# Patient Record
Sex: Female | Born: 1960 | Race: Black or African American | Hispanic: No | Marital: Married | Smoking: Former smoker
Health system: Southern US, Community
[De-identification: ages and names within clinical notes are randomized; demographics above are authoritative.]

## PROBLEM LIST (undated history)

## (undated) DIAGNOSIS — I059 Rheumatic mitral valve disease, unspecified: Secondary | ICD-10-CM

## (undated) DIAGNOSIS — E041 Nontoxic single thyroid nodule: Secondary | ICD-10-CM

## (undated) DIAGNOSIS — F32A Depression, unspecified: Secondary | ICD-10-CM

## (undated) DIAGNOSIS — I00 Rheumatic fever without heart involvement: Secondary | ICD-10-CM

## (undated) DIAGNOSIS — I10 Essential (primary) hypertension: Secondary | ICD-10-CM

## (undated) HISTORY — PX: OTHER SURGICAL HISTORY: SHX169

## (undated) HISTORY — PX: MITRAL VALVE REPLACEMENT: SHX147

## (undated) HISTORY — PX: SINUS SURGERY: SHX187

---

## 2005-06-07 ENCOUNTER — Emergency Department: Admit: 2005-06-07 | Payer: Self-pay | Source: Emergency Department | Admitting: Emergency Medicine

## 2007-11-11 ENCOUNTER — Emergency Department: Admit: 2007-11-11 | Payer: Self-pay | Source: Emergency Department | Admitting: Emergency Medicine

## 2008-01-26 ENCOUNTER — Emergency Department: Admit: 2008-01-26 | Payer: Self-pay | Source: Emergency Department | Admitting: Emergency Medicine

## 2010-05-18 ENCOUNTER — Emergency Department: Admit: 2010-05-18 | Payer: Self-pay | Source: Emergency Department | Admitting: Internal Medicine

## 2010-05-18 LAB — CBC AND DIFFERENTIAL
Basophils Absolute Automated: 0.01 10*3/uL (ref 0.00–0.20)
Basophils Automated: 0 % (ref 0–2)
Eosinophils Absolute Automated: 0.12 10*3/uL (ref 0.00–0.70)
Eosinophils Automated: 2 % (ref 0–5)
Hematocrit: 41.1 % (ref 37.0–47.0)
Hgb: 14.1 g/dL (ref 12.0–16.0)
Immature Granulocytes Absolute: 0.02 10*3/uL
Immature Granulocytes: 0 % (ref 0–1)
Lymphocytes Absolute Automated: 2.12 10*3/uL (ref 0.50–4.40)
Lymphocytes Automated: 31 % (ref 15–41)
MCH: 33.2 pg — ABNORMAL HIGH (ref 28.0–32.0)
MCHC: 34.3 g/dL (ref 32.0–36.0)
MCV: 96.7 fL (ref 80.0–100.0)
MPV: 9.6 fL (ref 9.4–12.3)
Monocytes Absolute Automated: 0.7 10*3/uL (ref 0.00–1.20)
Monocytes: 10 % (ref 0–11)
Neutrophils Absolute: 3.82 10*3/uL (ref 1.80–8.10)
Neutrophils: 57 % (ref 52–75)
Nucleated RBC: 0 /100 WBC
Platelets: 236 10*3/uL (ref 140–400)
RBC: 4.25 10*6/uL (ref 4.20–5.40)
RDW: 13 % (ref 12–15)
WBC: 6.77 10*3/uL (ref 3.50–10.80)

## 2010-05-18 LAB — COMPREHENSIVE METABOLIC PANEL
ALT: 15 U/L (ref 0–55)
AST (SGOT): 16 U/L (ref 5–34)
Albumin/Globulin Ratio: 1.1 (ref 0.9–2.2)
Albumin: 3.7 g/dL (ref 3.5–5.0)
Alkaline Phosphatase: 64 U/L (ref 40–150)
Anion Gap: 9 (ref 5.0–15.0)
BUN: 14 mg/dL (ref 7.0–19.0)
Bilirubin, Total: 0.5 mg/dL (ref 0.2–1.2)
CO2: 29 mEq/L (ref 22–29)
Calcium: 9.3 mg/dL (ref 8.5–10.5)
Chloride: 104 mEq/L (ref 98–107)
Creatinine: 0.9 mg/dL (ref 0.6–1.0)
Globulin: 3.3 g/dL (ref 2.0–3.6)
Glucose: 98 mg/dL (ref 70–100)
Potassium: 3.8 mEq/L (ref 3.5–5.1)
Protein, Total: 7 g/dL (ref 6.0–8.3)
Sodium: 142 mEq/L (ref 136–145)

## 2010-05-18 LAB — HEMOLYSIS INDEX: Hemolysis Index: 14 Index (ref 0–18)

## 2010-05-18 LAB — GFR: EGFR: >60

## 2011-01-13 ENCOUNTER — Emergency Department
Admit: 2011-01-13 | Disposition: A | Discharge status: Home / Self Care | Payer: Self-pay | Source: Emergency Department | Admitting: Internal Medicine

## 2011-02-25 ENCOUNTER — Emergency Department
Admit: 2011-02-25 | Discharge: 2011-02-25 | Disposition: A | Discharge status: Home / Self Care | Payer: Self-pay | Source: Emergency Department | Admitting: Pediatric Emergency Medicine

## 2011-02-25 LAB — CBC AND DIFFERENTIAL
Basophils Absolute Automated: 0.01 10*3/uL (ref 0.00–0.20)
Basophils Automated: 0 % (ref 0–2)
Eosinophils Absolute Automated: 0.03 10*3/uL (ref 0.00–0.70)
Eosinophils Automated: 1 % (ref 0–5)
Hematocrit: 45.3 % (ref 37.0–47.0)
Hgb: 15.6 g/dL (ref 12.0–16.0)
Immature Granulocytes Absolute: 0.01 10*3/uL
Immature Granulocytes: 0 % (ref 0–1)
Lymphocytes Absolute Automated: 1.66 10*3/uL (ref 0.50–4.40)
Lymphocytes Automated: 31 % (ref 15–41)
MCH: 32.4 pg — ABNORMAL HIGH (ref 28.0–32.0)
MCHC: 34.4 g/dL (ref 32.0–36.0)
MCV: 94 fL (ref 80.0–100.0)
MPV: 9.9 fL (ref 9.4–12.3)
Monocytes Absolute Automated: 0.49 10*3/uL (ref 0.00–1.20)
Monocytes: 9 % (ref 0–11)
Neutrophils Absolute: 3.16 10*3/uL (ref 1.80–8.10)
Neutrophils: 59 % (ref 52–75)
Nucleated RBC: 0 /100 WBC
Platelets: 246 10*3/uL (ref 140–400)
RBC: 4.82 10*6/uL (ref 4.20–5.40)
RDW: 13 % (ref 12–15)
WBC: 5.35 10*3/uL (ref 3.50–10.80)

## 2011-02-25 LAB — HEMOLYSIS INDEX: Hemolysis Index: 2 Index (ref 0–18)

## 2011-02-25 LAB — BASIC METABOLIC PANEL
Anion Gap: 8 (ref 5.0–15.0)
BUN: 12 mg/dL (ref 7.0–19.0)
CO2: 29 mEq/L (ref 22–29)
Calcium: 10.4 mg/dL (ref 8.5–10.5)
Chloride: 101 mEq/L (ref 98–107)
Creatinine: 0.9 mg/dL (ref 0.6–1.0)
Glucose: 98 mg/dL (ref 70–100)
Potassium: 3.9 mEq/L (ref 3.5–5.1)
Sodium: 138 mEq/L (ref 136–145)

## 2011-02-25 LAB — GFR: EGFR: >60

## 2011-11-21 ENCOUNTER — Emergency Department
Admission: EM | Admit: 2011-11-21 | Discharge: 2011-11-21 | Disposition: A | Discharge status: Home / Self Care | Payer: Enrolled Prime—HMO | Attending: Emergency Medicine | Admitting: Emergency Medicine

## 2011-11-21 ENCOUNTER — Emergency Department: Payer: Enrolled Prime—HMO

## 2011-11-21 DIAGNOSIS — M542 Cervicalgia: Secondary | ICD-10-CM | POA: Insufficient documentation

## 2011-11-21 DIAGNOSIS — IMO0002 Reserved for concepts with insufficient information to code with codable children: Secondary | ICD-10-CM | POA: Insufficient documentation

## 2011-11-21 DIAGNOSIS — G8929 Other chronic pain: Secondary | ICD-10-CM | POA: Insufficient documentation

## 2011-11-21 DIAGNOSIS — M5416 Radiculopathy, lumbar region: Secondary | ICD-10-CM

## 2011-11-21 DIAGNOSIS — M549 Dorsalgia, unspecified: Secondary | ICD-10-CM | POA: Insufficient documentation

## 2011-11-21 HISTORY — DX: Essential (primary) hypertension: I10

## 2011-11-21 MED ORDER — IBUPROFEN 600 MG PO TABS
600.00 mg | ORAL_TABLET | Freq: Once | ORAL | Status: AC
Start: 2011-11-21 — End: 2011-11-21
  Administered 2011-11-21: 600 mg via ORAL
  Filled 2011-11-21: qty 1

## 2011-11-21 MED ORDER — IBUPROFEN 600 MG PO TABS
600.00 mg | ORAL_TABLET | Freq: Four times a day (QID) | ORAL | Status: AC | PRN
Start: 2011-11-21 — End: 2011-12-01

## 2011-11-21 MED ORDER — DIAZEPAM 5 MG PO TABS
5.00 mg | ORAL_TABLET | Freq: Once | ORAL | Status: AC
Start: 2011-11-21 — End: 2011-11-21
  Administered 2011-11-21: 5 mg via ORAL
  Filled 2011-11-21: qty 1

## 2011-11-21 MED ORDER — HYDROMORPHONE HCL PF 1 MG/ML IJ SOLN
0.50 mg | Freq: Once | INTRAMUSCULAR | Status: AC
Start: 2011-11-21 — End: 2011-11-21
  Administered 2011-11-21: 0.5 mg via INTRAMUSCULAR
  Filled 2011-11-21: qty 1

## 2011-11-21 NOTE — ED Notes (Signed)
Pt reports neck pain since may. Pt reports right arm numbness and weakness. Pt reprots left arm pain and numbness. Pt reports pain on ROM. Pt reports bilateral leg weakness. Pt was seen at [facility] hospital today and left because they werent addressing her pain. Pt is alert and oriented x3, nad

## 2011-11-21 NOTE — ED Provider Notes (Signed)
EMERGENCY DEPARTMENT HISTORY AND PHYSICAL EXAM    Date: 11/21/2011  Patient Name: Alicia Waters  Attending Physician: Lorenza Cambridge, MD  Mid-level: Baruch Merl      History of Presenting Illness     No chief complaint on file.      History Provided By: Patient    Chief Complaint: chronc neck pain/low back apin  Onset: neck since may, low back for almost 10 years  Timing: gradual  Location: bilateral neck, left low back  Quality: sharp stabbing, tight, aching  Severity: severe  Modifying Factors: Back Pain Red Flags: (-) CA, recent surgery, IV drug abuse, fevers, incontinence/retention, saddle anesthesia.         Additional History: Alicia Waters is a 51 y.o. female, hx of chronic low back pain x10 years and now neck pain since may that has been diagnosed as severe cervical disc disease by MRI at the [place]. Has been seeing a [place] doctor and "nothing they are giving me' is working for pain. Was referred to pain management and  Was given rx for robaxin and nucynta which she states also doesn't work. Pain is unchanges, tingling down left arm x2 weeks but her doctors are aware. Intermittent weakness in right arm she states but it is nonfocal and non at present. Pain in left low back radiates to level of foot and feels tingling. No red flags. Has only taken her Robaxin x1 today and no nucynta. Was seen earlier today at [facility] and left because they didn't control her pain.    PCP: No primary provider on file.    No current facility-administered medications for this encounter.  No current outpatient prescriptions on file.    Past Medical History     No past medical history on file.  No past surgical history on file.    Family History     No family history on file.      Social History     History     Social History   . Marital Status: Married     Spouse Name: N/A     Number of Children: N/A   . Years of Education: N/A     Social History Main Topics   . Smoking status: Not on file   . Smokeless tobacco: Not on file   .  Alcohol Use: Not on file   . Drug Use: Not on file   . Sexually Active: Not on file     Other Topics Concern   . Not on file     Social History Narrative   . No narrative on file         Allergies     Allergies not on file    Review of Systems       Review of Systems   Constitutional: Negative for fever and chills.   HENT: Positive for neck pain. Negative for ear pain, congestion and sore throat.    Eyes: Negative for blurred vision, pain and redness.   Respiratory: Negative for cough and shortness of breath.    Cardiovascular: Negative for chest pain.   Gastrointestinal: Negative for nausea, vomiting and abdominal pain.   Genitourinary:        No incontinence/retention or hematuria     Musculoskeletal: Positive for myalgias and back pain. Negative for joint pain and falls.   Neurological: Positive for tingling. Negative for dizziness, sensory change, focal weakness and headaches.   Psychiatric/Behavioral: Negative for substance abuse.  Physical Exam   BP 134/89  Pulse 85  Temp 97.6 F (36.4 C)  Resp 18  Ht 1.6 m  Wt 72.576 kg  BMI 28.34 kg/m2  SpO2 100%    CONSTITUTIONAL: Well-developed, well-nourished. Alert, cooperative and in   Moderate acute distress. Vital signs reviewed. Vital signs stable  HEAD: Normocephalic, atraumatic.  EYES: Normal Inspection; PERRL. EOM's intact. Sclera not injected. Conjunctiva clear. No discharge or tearing.   ENT: Normal nares without rhinorrhea. TM's clear bilaterally. Pharynx   visualized without erythema, tonsillar edema or exudates. Uvula midline, no trismus or drooling.  NECK: diffuse midline and paraspinal ttp throughout without focal bony tenderness. Able to AROM laterally to both left and right but limited due to pain.   RESPIRATORY: Good air movement bilaterally no respiratory distress. CTA throughout with no wheezing, rales or rhonchi. No retractions or use of accessory muscles. No respiratory distress.   CARDIAC: RRR. Normal S1 and S2 without murmurs, rubs or  gallops. Pedal pulses 2+.  GI: Soft and non-tender throughout. Non-distended, normal bowel sounds x4 quadrants,no pulsatile masses  BACK: Normal inspection,+ left low lumbar ttp, no midline or thoracic ttp throughout. +pain on movement with flexion. No scoliosis. + SLR left side.  MS: No bony tenderness. Full ROM without pain. No edema, erythema or STS.  Pulses 2+ and symmetric.    SKIN: Warm and dry. No rash or lesions. No abrasions or breaks in skin.  NEURO: A&O x 3. CN II-XII intact. 5/5 strength in bilateral upper and lower   extremities. Sensory intact throughout. Able to walk on heels and toes, patellar dtrs 2+, ambulates without assistance. Gait steady and balanced.  Sensory deficit not at present and no deficit on exam.  PSYCH: Normal affect, insight and concentration for age.       Diagnostic Study Results     Labs -     Results     ** No Results found for the last 24 hours. **          Radiologic Studies -   Radiology Results (24 Hour)     ** No Results found for the last 24 hours. **      .    Clinical Course in the Emergency Department     Consultations:  D/w Dr. Iris Pert who agreed with plan.    Re-evaluations:  7:57 PM  Pt reports significant improvement with pain meds,     Dr. Riki Altes was paged but no answering service      Medical Decision Making   I am the first provider for this patient.    I reviewed the vital signs, available nursing notes, past medical history, past surgical history, family history and social history.    Vital Signs-Reviewed the patient's vital signs.   Patient Vitals for the past 12 hrs:   BP Temp Pulse Resp   11/21/11 1834 134/89 mmHg 97.6 F (36.4 C) 85  18          Diagnosis and Treatment Plan       Clinical Impression: No diagnosis found.    Treatment Plan: chronic pain, needs pain meds to be taken as directed and muscle relaxers as well as NSAIDs. Will not give pain med rx here as she is under pain contract, will need to f/u with one of her doctors. Return precautions  provided    Attending's Note (MLP) Attestestation         Baruch Merl, [place]  11/21/11 2031

## 2011-11-21 NOTE — ED Provider Notes (Signed)
Pt was seen and examined by the PA. Plan of care was discussed with me.      ECG reviewed at 1949. HR 68, NSR, nl st segments, nl axis    Lorenza Cambridge, MD  11/21/11 2149

## 2011-11-21 NOTE — Discharge Instructions (Signed)
RETURN IF WORSE    WARM MOIST HEAT TO BACK    MOTRIN FOR PAIN, HOME MUSCLE RELAXERS AND PAIN MEDS    SEE YOUR DOCTORS FOR FOLLOW UP      Cervical Radiculopathy    You have been seen for a cervical radiculopathy.    Your spine has bones called "vertebrae." In between the bones there are soft cushions. These are called "disks." The disks keep the vertebrae from rubbing against each other. Inside of each disk is a thick, jelly-like substance called the "nucleus pulposus." Sometimes when the spine is injured, a disk is damaged and the nucleus pulposus leaks out of the disk. This is called a "disk herniation." As people age, the disks get brittle and delicate. If this happens, you might have a disk herniation. This is possible even if you don t remember getting injured.    Sometimes a herniated disk presses on a nerve in the spine. This causes pain near the herniated disk. Since you have symptoms in your neck and arm, the problem is in the cervical (neck) spine. Other problems can cause a radiculopathy (nerve pain), including a narrowed opening where the nerves come out.    Some symptoms of a cervical radiculopathy are:     Pain down one of your arms.   A burning feeling down your arm.   Numbness (pins and needles or loss of feeling) in your arm.   In serious cases, your arm may feel weak.    This problem is often treated with rest, physical therapy, and medication for the swelling and pain. If these treatments don t work, surgery may be needed. Sometimes taking steroids (like Prednisone) for a few days can help the pain.    We don't believe your condition is dangerous right now. However, you need to be careful. Sometimes a problem that seems small can get serious later. Therefore, it is very important for you to come back here or go to the nearest Emergency Department if you don t get better or your symptoms get worse.     You may have been referred to get an MRI of your spine or an EMG. These tests look at  your problem more closely.    Follow up with your doctor or the referral doctor as soon as possible.    YOU SHOULD SEEK MEDICAL ATTENTION IMMEDIATELY, EITHER HERE OR AT THE NEAREST EMERGENCY DEPARTMENT, IF ANY OF THE FOLLOWING OCCURS:     You lose bowel or bladder control (you soil or wet yourself).   You feel week or can t use your arm(s).   The medication doesn t help the pain.   You have a fever (temperature higher than 100.64F or 38C) or shaking chills.   You have serious pain over one bone (vertebra) in your neck.    If you can t follow up with your doctor, or if at any time you feel you need to be rechecked or seen again, come back here or go to the nearest emergency department.    Lumbar Radiculopathy    You have been seen for a lumbar radiculopathy.     Your spine has bones called "vertebrae." In between the bones there are soft cushions. These are called "disks." The disks keep the vertebrae from rubbing against each other. Inside of each disk is a thick, jelly-like substance called the "nucleus pulposus." Sometimes when the spine is injured, a disk is damaged and the nucleus pulposus leaks out of the disk.  This is called a "disk herniation." As people age, the disks get brittle and delicate. If this happens, you might have a disk herniation. This is possible even if you don t remember getting injured.    Sometimes a herniated disk presses on a nerve in the spine. This causes pain near the herniated disk. Since you have symptoms in your lower back and leg, the problem is in the lumbar (lower back) spine. Other problems can cause a radiculopathy (nerve pain), including a narrowed opening where the nerves come out.    Some symptoms of a lumbar radiculopathy are:     Pain down one of your legs.   A burning feeling down your leg.   Numbness (pins and needles or loss of feeling) in your leg.   In serious cases, your leg may feel weak.    This problem is often treated with rest, physical  therapy, and medication for the swelling and pain. If these treatments don t work, surgery may be needed. Sometimes taking steroids (like Prednisone) for a few days can help the pain.    We don't believe your condition is dangerous right now. However, you need to be careful. Sometimes a problem that seems small can get serious later. Therefore, it is very important for you to come back here or go to the nearest Emergency Department if you don t get better or your symptoms get worse.     You may have been referred to get an MRI of your spine or an EMG. These tests look at your problem more closely.    Follow up with your doctor or the referral doctor as soon as possible.    YOU SHOULD SEEK MEDICAL ATTENTION IMMEDIATELY, EITHER HERE OR AT THE NEAREST EMERGENCY DEPARTMENT, IF ANY OF THE FOLLOWING OCCURS:     You lose bowel or bladder control (you soil or wet yourself).   You feel week or can t use your leg(s).   The medication doesn t help the pain.   You have a fever (temperature higher than 100.51F or 38C) or shaking chills.   You have serious pain over one bone (vertebra) in your back.    If you can t follow up with your doctor, or if at any time you feel you need to be rechecked or seen again, come back here or go to the nearest emergency department.      Chronic Pain Exacerbation    You have been seen for your chronic pain problem.    While the Emergency Department/Urgent Care is available for emergencies related to your painful problem, you need to see your regular doctor for the ongoing care of your pain.    ALL refills for your pain medications MUST be done by your primary care doctor or the pain specialist who takes care of your pain.    The Emergency Department/Urgent Care is not the appropriate place for the ongoing care of chronic pain.    If you have not seen a pain specialist, ask the physician to give you some information on who to contact and to refer you to one.    THIS STATEMENT IS  PROVIDED FOR YOUR PROTECTION AND FOR YOUR INFORMATION ONLY AND IS NOT MEANT TO IMPLY ANY ILLEGAL ACTIVITY ON YOUR PART: It is a felony to obtain narcotic pain medications by intentionally deceiving the physician caring for you. This includes but is not limited to: Getting multiple prescriptions for pain medications by seeing more than one doctor, providing  any false information to obtain a prescription, giving any false information about the nature or severity of your medical condition.   Some common narcotic pain medications: Oxycodone (Percocet, Roxicet), Hydrocodone (Vicodin, Lortab, Norco), Oxycodone-long-acting (OxyContin), Meperidine (Demerol), Hydromorphone (Dilaudid), Morphine, Codeine (Tylenol #3, Tylenol #4), Propoxyphene (Darvon, Darvocet).    YOU SHOULD SEEK MEDICAL ATTENTION IMMEDIATELY, EITHER HERE OR AT THE NEAREST EMERGENCY DEPARTMENT, IF ANY OF THE FOLLOWING OCCURS:   You develop any significant change in your pain pattern.   Your symptoms become worse.   You have any other concerns.

## 2011-11-23 LAB — ECG 12-LEAD
Atrial Rate: 68 {beats}/min
P Axis: 70 degrees
P-R Interval: 150 ms
Q-T Interval: 440 ms
QRS Duration: 78 ms
QTC Calculation (Bezet): 467 ms
R Axis: 53 degrees
T Axis: 38 degrees
Ventricular Rate: 68 {beats}/min

## 2012-01-08 ENCOUNTER — Emergency Department
Admission: EM | Admit: 2012-01-08 | Discharge: 2012-01-08 | Disposition: A | Discharge status: Home / Self Care | Payer: Enrolled Prime—HMO | Attending: Emergency Medicine | Admitting: Emergency Medicine

## 2012-01-08 ENCOUNTER — Emergency Department: Payer: Enrolled Prime—HMO

## 2012-01-08 DIAGNOSIS — Z79899 Other long term (current) drug therapy: Secondary | ICD-10-CM | POA: Insufficient documentation

## 2012-01-08 DIAGNOSIS — R6889 Other general symptoms and signs: Secondary | ICD-10-CM | POA: Insufficient documentation

## 2012-01-08 DIAGNOSIS — I10 Essential (primary) hypertension: Secondary | ICD-10-CM | POA: Insufficient documentation

## 2012-01-08 DIAGNOSIS — F17200 Nicotine dependence, unspecified, uncomplicated: Secondary | ICD-10-CM | POA: Insufficient documentation

## 2012-01-08 DIAGNOSIS — R0989 Other specified symptoms and signs involving the circulatory and respiratory systems: Secondary | ICD-10-CM

## 2012-01-08 NOTE — ED Notes (Addendum)
As per pt. She had a throat biopsy done Dec 28, 2011, done at [facility], 2 days ago pt. Started having pain on the throat. Pt. States feels like there is food stuck inn the throat.

## 2012-01-08 NOTE — ED Provider Notes (Signed)
EMERGENCY DEPARTMENT HISTORY AND PHYSICAL EXAM    Date: 01/08/2012  Patient Name: Alicia Waters  Attending Physician: Ok Edwards, MD  Physician Extender: Arnold Long  Patient DOB:  1961-01-06  MRN:  [id number]  Room:  XB28/UX32      History of Presenting Illness     Chief Complaint: Foreign body sensation in throat    Historian:  Patient     51 y.o. female p/w a foreign body sensation in her throat x 2 days. She had a biopsy of a thyroid nodule performed 10 days ago. She had no symptoms after the procedure but developed a feeling of "swelling" late last week. She describes her discomfort as 2/10 and intermittent. She is handling PO fluids and food without difficulty. She denies drooling or voice changes. She denies pain in her throat. Her biopsy results showed that the nodule was benign. The procedure was performed by Dr. Uvaldo Bristle at [facility]. She has a hx of htn but is otherwise healthy and has no other complaints at this time.     Past Medical History     Past Medical History   Diagnosis Date   . Hypertensive disorder        Past Surgical History     Past Surgical History   Procedure Date   . Sinus surgery    . Throat biiopsy    . Cesarean section        Family History     History reviewed. No pertinent family history.    Social History     History     Social History   . Marital Status: Married     Spouse Name: N/A     Number of Children: N/A   . Years of Education: N/A     Social History Main Topics   . Smoking status: Current Everyday Smoker -- 0.5 packs/day     Types: Cigarettes   . Smokeless tobacco: Never Used   . Alcohol Use: No   . Drug Use: No   . Sexually Active: Not on file     Other Topics Concern   . Not on file     Social History Narrative   . No narrative on file       Allergies     No Known Allergies    Home Medications       (Not in a hospital admission)    ED Medications Administered     ED Medication Orders     None            Review of Systems     CONSTITUTIONAL: No fever, chills,  fatigue, weight loss or malaise.  ENMT: See HPI. No otalgia, otorrhea, rhinorrhea, voice changes, drooling, neck pain or stiffness.  CARDIAC: No chest pain.  RESPIRATORY: No cough, wheezing, stridor or shortness of breath.  GI: No abdominal pain, nausea, vomiting or changes in appetite.  NEURO: No headache, dizziness, weakness, numbness or LOC.   INTEGUMENTARY: No skin rash.    Physical Exam     CONSTITUTIONAL: Well-developed, well-nourished. Alert, cooperative and in   no acute distress. Vital signs reviewed.   HEAD: Normocephalic, atraumatic.  ENT: Normal nares without rhinorrhea. TM's clear bilaterally. Pharynx visualized without erythema, tonsillar edema or exudates. Uvula midline. No abrasions or foreign bodies visualized. The patient is speaking in a clear voice and is handling her secretions well.   NECK: Non tender. Full ROM without pain. No adenopathy.   RESPIRATORY: Good air movement bilaterally.  No wheezing, rales or rhonchi.   No retractions or use of accessory muscles. No respiratory distress.   CARDIAC: RRR. Normal S1 and S2 without murmurs, rubs or gallops.   SKIN: Warm and dry. No rash or lesions.   NEURO: A&O x 3. Gait steady and balanced.     Procedures     N/A    Diagnostic Study Results     EKG: N/A    Monitor: N/A    Laboratory results reviewed by ED provider:  Results     ** No Results found for the last 24 hours. **          Radiologic study results reviewed by ED provider:  Radiology Results (24 Hour)     ** No Results found for the last 24 hours. **      .    Rendering Provider: Arnold Long      VS     Filed Vitals:    01/08/12 1245   BP: 139/82   Pulse: 80   Temp: 97.8 F (36.6 C)   Resp: 18   SpO2: 98%         Clinical Course in Emergency Department     Consults: N/A    Reevaluation: The patient agrees to f/u with Dr. Uvaldo Bristle for further evaluation on Tuesday 01/10/12. She has been given strict instructions to return to the ED for any new or worsening symptoms.    Diagnosis and  Disposition     Diagnosis  Foreign body sensation - throat    Disposition  Home       SIGNED BY: Arnold Long, PA-C        Lendell Caprice [place], [place]  01/08/12 1313    Lendell Caprice [place], [place]  01/08/12 1314    Lendell Caprice [place], [place]  01/08/12 1317

## 2012-01-08 NOTE — Discharge Instructions (Signed)
Return to the ER if your symptoms worsen or if you develop throat swelling,   difficulty swallowing, drooling, voice changes, difficulty breathing, fever or   any other new symptoms or concerns.    Follow up with Dr. Uvaldo Bristle on Tuesday 01/10/12 for further evaluation.    Rest and stay well-hydrated.

## 2012-01-09 NOTE — ED Provider Notes (Signed)
I have reviewed the notes, assessments, and/or procedures performed by the PA, I concur with her/his documentation of KRYSTOL ROCCO.      Ok Edwards, MD  01/09/12 [phone]

## 2012-01-27 HISTORY — PX: CERVICAL FUSION: SHX112

## 2012-04-07 ENCOUNTER — Emergency Department
Admission: EM | Admit: 2012-04-07 | Discharge: 2012-04-07 | Disposition: A | Discharge status: Home / Self Care | Payer: Enrolled Prime—HMO | Attending: Emergency Medicine | Admitting: Emergency Medicine

## 2012-04-07 ENCOUNTER — Emergency Department: Payer: Enrolled Prime—HMO

## 2012-04-07 DIAGNOSIS — I10 Essential (primary) hypertension: Secondary | ICD-10-CM | POA: Insufficient documentation

## 2012-04-07 DIAGNOSIS — J069 Acute upper respiratory infection, unspecified: Secondary | ICD-10-CM | POA: Insufficient documentation

## 2012-04-07 DIAGNOSIS — Z981 Arthrodesis status: Secondary | ICD-10-CM | POA: Insufficient documentation

## 2012-04-07 DIAGNOSIS — J329 Chronic sinusitis, unspecified: Secondary | ICD-10-CM | POA: Insufficient documentation

## 2012-04-07 MED ORDER — AMOXICILLIN-POT CLAVULANATE 875-125 MG PO TABS
1.0000 | ORAL_TABLET | Freq: Two times a day (BID) | ORAL | Status: AC
Start: 2012-04-07 — End: 2012-04-21

## 2012-04-07 MED ORDER — AMOXICILLIN-POT CLAVULANATE 875-125 MG PO TABS
1.0000 | ORAL_TABLET | Freq: Once | ORAL | Status: DC
Start: 2012-04-07 — End: 2012-04-08
  Filled 2012-04-07: qty 1

## 2012-04-07 MED ORDER — FLUTICASONE PROPIONATE 50 MCG/ACT NA SUSP
2.0000 | Freq: Every day | NASAL | Status: AC
Start: 2012-04-07 — End: 2012-04-14

## 2012-04-07 NOTE — Discharge Instructions (Signed)
RETURN IF WORSE    FLONASE AS DIRECTED    TAKE FULL COURSE OF ANTIBIOTICS AS DIRECTED    FOLLOW UP WITH YOUR DOCTOR FOR RECHECK THIS WEEK        Sinusitis    You have been seen for a sinus infection.    Sinus infections are common. They often happen after people have a virus or a common cold.    Symptoms are: Pain in the face, green or yellow mucous from the nose, fevers and chills. There may also be a feeling of fullness or pressure in the face.    Decongestants may help. This helps the sinuses drain. Sometimes a steroid spray is used.    You may need antibiotics for the infection. Not all cases of sinusitis need antibiotics. Viruses cause most sinusitis. Antibiotics do not work on viruses. Sometimes sinusitis and be caused by bacteria. These cases usually get better with medicine to help the sinuses drain. Antibiotics should be given only to patients who have symptoms for more than 2 weeks and if they have fevers, severe sinus pain and pus mixed in with the mucus.    YOU SHOULD SEEK MEDICAL ATTENTION IMMEDIATELY, EITHER HERE OR AT THE NEAREST EMERGENCY DEPARTMENT, IF ANY OF THE FOLLOWING OCCURS:   You do not get better with treatment.   You have severe headaches and high fevers or any confusion.   You have worse pain in the face. Your face gets more swollen.    Upper Respiratory Infection    You have been diagnosed with a viral upper respiratory infection, often called a "URI."    The symptoms of a viral upper respiratory infection may include fever, runny nose, congestion and sinus fullness, facial pain, earache, sore throat, cough, and occasionally wheezing. The symptoms usually improve within 3 to 4 days. It might take up to 10 days before you feel completely better.    URI's are treated with fluids, rest, and medication for fever and pain. Sometimes decongestants, cough medications or-medications for wheezing can also help.    Antibiotics have NO effect whatsoever on viruses and ARE NOT needed for  a URI. Taking antibiotics when they are not necessary can cause side effects (like diarrhea or allergic reactions). It can also cause resistance, meaning antibiotics won't work when you need them in the future.    You do not need to follow up with a doctor unless you feel worse or have other concerns.    YOU SHOULD SEEK MEDICAL ATTENTION IMMEDIATELY, EITHER HERE OR AT THE NEAREST EMERGENCY DEPARTMENT, IF ANY OF THE FOLLOWING OCCURS:   You have new or worse symptoms or concerns.   You are short of breath.   You have a severe headache, stiff neck, confusion, or problems thinking.   You have nasal discharge, fever, or productive cough (one that brings up mucous from the lungs) lasting more that 10 days. Occasionally a viral cold may lead into a bacterial infection, such as a sinus infection, ear infection or pneumonia. Taking antibiotics during the cold will NOT prevent these infections, but if a secondary infection occurs, you might require additional treatment.      How To Quit Smoking  Smoking is one of the hardest habits to break. About half of all those who have ever smoked have been able to quit, and most of those (about 70%) who still smoke want to quit. Here are some of the best ways to stop smoking.    Keep Trying:   It  takes most smokers about 8 tries before they are finally able to fully quit. So, the more often you try and fail, the better your chance of quitting the next time! So, don't give up!  Go Cold [place]:   Most ex-smokers quit cold [place]. Trying to cut back gradually doesn't seem to work as well, perhaps because it continues the smoking habit. Also, it is possible to fool yourself by inhaling more while smoking fewer cigarettes. This results in the same amount of nicotine in your body!  Get Support:   Support programs can make an important difference, especially for the heavy smoker. These groups offer lectures, methods to change your behavior and peer support. Call the free national  Quitline for more information. 800-QUIT-NOW 410-234-5771). Low-cost or free programs are offered by many hospitals, local chapters of the American Lung Association 414-676-6287) and the American Cancer Society 4193024394). Support at home is important too. Non-smokers can help by offering praise and encouragement. If the smoker fails to quit, encourage them to try again!  Over-The-Counter Medicines:   For those who can't quit on their own, Nicotine Replacement Therapy (NRT) may make quitting much easier. Certain aids such as the nicotine patch, gum and lozenge are available without a prescription. However, it is best to use these under the guidance of your doctor. The skin patch provides a steady supply of nicotine to the body. Nicotine gum and lozenge gives temporary bursts of low levels of nicotine. Both methods take the edge off the craving for cigarettes. WARNING: If you feel symptoms of nicotine overdose, such as nausea, vomiting, dizziness, weakness, or fast heartbeat, stop using these and see your doctor.  Prescription Medicines:   After evaluating your smoking patterns and prior attempts at quitting, your doctor may offer a prescription medicine such as bupropion (Zyban, Wellbutrin), varenicline (Chantix, Champix), a niocotine inhaler or nasal spray. Each has its unique advantage and side effects which your doctor can review with you.  Health Benefits Of Quitting:   The benefits of quitting start right away and keep improving the longer you go without smoking:   20 minutes: blood pressure and pulse return to normal   8 hours: oxygen levels return to normal   2 days: ability to smell and taste begins to improve as damaged nerves start to regrow   2-3 weeks: circulation and lung function improves   1-9 months: decreased cough, congestion and shortness of breath; less tired   1 year: risk of heart attack decreases by half   5 years: risk of lung cancer decreases by half; risk of stroke becomes the  same as a non-smoker  For information about how to quit smoking, visit the following links:   [employer] , " [employer], Quit Smoking Today " - an online booklet. https://www.brown.net/.pdf   Smokefree.gov https://www.nelson-thomas.biz/   QuitNet [employer]fr   [address] [place], [place] [address]. All rights reserved. This information is not intended as a substitute for professional medical care. Always follow your healthcare professional's instructions.

## 2012-04-07 NOTE — ED Notes (Signed)
Cold symptoms x 1 week, now facial pain over cheeks, forehead and green nasal drainage. Productive cough with clear to green sputum.

## 2012-04-07 NOTE — ED Provider Notes (Signed)
EMERGENCY DEPARTMENT HISTORY AND PHYSICAL EXAM    Date: 04/07/2012  Patient Name: Alicia Waters  Attending Physician: Brynda Rim, MD  Mid-level: Baruch Merl      History of Presenting Illness     Chief Complaint   Patient presents with   . Facial Pain   . Sinusitis       History Provided By: Patient    Chief Complaint: Sinus pressure  Onset: >1 week ago  Timing: gradual  Location: maxillary/frontal  Quality: pressure  Severity: moderate  Modifying Factors: none     Additional History: Alicia Waters is a 52 y.o. female, hx of HTN and recent cervical fusion, here for persistent facial congestion and yellow/white nasal [place] with productive cough of purulent post nasal drainage >1 week, initially with rhinorrhea and fever last week which has resolved, no with persistent pressure, pressure frontal headache congestions/cracking in ears. Denies any sore throat/cp/sob.     PCP: No primary provider on file.    Current facility-administered medications:amoxicillin-clavulanate (AUGMENTIN) 875-125 MG per tablet 1 tablet, 1 tablet, Oral, Once, Baruch Merl, PA  Current outpatient prescriptions:amoxicillin-clavulanate (AUGMENTIN) 875-125 MG per tablet, Take 1 tablet by mouth 2 (two) times daily., Disp: 28 tablet, Rfl: 0;  fexofenadine (ALLEGRA) 30 MG tablet, Take 30 mg by mouth 2 (two) times daily., Disp: , Rfl: ;  fluticasone (FLONASE) 50 MCG/ACT nasal spray, 2 sprays by Nasal route daily., Disp: 16 g, Rfl: 0;  LOSARTAN POTASSIUM PO, Take by mouth., Disp: , Rfl:   tapentadol HCl (NUCYNTA) 50 MG TABS tablet, Take 50 mg by mouth., Disp: , Rfl:     Past Medical History     Past Medical History   Diagnosis Date   . Hypertensive disorder      Past Surgical History   Procedure Date   . Sinus surgery    . Throat biiopsy    . Cesarean section    . Cervical fusion 01/2012       Family History   History reviewed. No pertinent family history.        Social History     History     Social History   . Marital Status:  Married     Spouse Name: N/A     Number of Children: N/A   . Years of Education: N/A     Social History Main Topics   . Smoking status: Current Every Day Smoker -- 0.5 packs/day for 20 years     Types: Cigarettes   . Smokeless tobacco: Never Used   . Alcohol Use: No   . Drug Use: No   . Sexually Active: Not on file     Other Topics Concern   . Not on file     Social History Narrative   . No narrative on file         Allergies     No Known Allergies    Review of Systems   Review of Systems   Constitutional: Negative for fever and chills.   HENT: Positive for congestion. Negative for ear pain and sore throat.    Eyes: Negative for blurred vision, pain and redness.   Respiratory: Positive for cough and sputum production. Negative for shortness of breath.    Cardiovascular: Negative for chest pain.   Gastrointestinal: Negative for nausea, vomiting and abdominal pain.   Musculoskeletal:        Recent cervical fusion pain, improving   Neurological: Positive for headaches. Negative for dizziness.  Physical Exam   BP 137/75  Pulse 67  Temp 98.6 F (37 C) (Oral)  Resp 18  SpO2 99%    CONSTITUTIONAL: Well-developed, well-nourished. Alert, cooperative and in   Mild acute distress. Vital signs reviewed. Vital signs stable  HEAD: Normocephalic, atraumatic. No facial cellulitis.  EYES: Normal Inspection; PERRL. EOM's intact. Sclera not injected. Conjunctiva clear. No discharge or tearing.   ENT: Bilateral nares injected, edematous without [place] or rhinorrhea. Bilateral maxillary and frontal sinus pressure and ttp. TM's clear bilaterally. Pharynx visualized without erythema, tonsillar edema or exudates. Uvula midline, no trismus or drooling.  NECK: No midline or paraspinal tenderness. Limited AROM due to recent surgery.  No lymphadenopathy.  RESPIRATORY: Good air movement bilaterally no respiratory distress. CTA throughout with no wheezing, rales or rhonchi. No respiratory distress.   CARDIAC: RRR. Normal S1 and S2  without murmurs, rubs or gallops.   NEURO: A&O x 3.GCS 15, no focal deficit on gross inspection.  PSYCH: Normal affect, insight and concentration for age.       Diagnostic Study Results     Labs -  Labs Reviewed - No data to display    Radiologic Studies -   Radiology Results (24 Hour)     ** No Results found for the last 24 hours. **      .    Clinical Course in the Emergency Department     Consultations:  D/w Dr. Estill Cotta who agreed with plan.      Medical Decision Making   I am the first provider for this patient.    I reviewed the vital signs, available nursing notes, past medical history, past surgical history, family history and social history.    Vital Signs-Reviewed the patient's vital signs.     Patient Vitals for the past 12 hrs:   BP Temp Pulse Resp   04/07/12 2205 137/75 mmHg 98.6 F (37 C) 67  18          Diagnosis and Treatment Plan       Clinical Impression:   1. Sinusitis    2. Upper respiratory infection        Treatment Plan: [place] home with flonase and antibiotics to tx sinusitis.    Attending's Note (MLP) Attestestation         Baruch Merl, [place]  04/07/12 2259

## 2012-04-08 NOTE — ED Provider Notes (Signed)
Pt seen and examined with PA   Agree with assessment    Cindi Carbon, MD  04/08/12 2002

## 2012-09-14 ENCOUNTER — Other Ambulatory Visit: Payer: Self-pay | Admitting: Physical Medicine & Rehabilitation

## 2012-09-14 DIAGNOSIS — M961 Postlaminectomy syndrome, not elsewhere classified: Secondary | ICD-10-CM

## 2012-09-14 DIAGNOSIS — M25511 Pain in right shoulder: Secondary | ICD-10-CM

## 2012-09-14 DIAGNOSIS — M75110 Incomplete rotator cuff tear or rupture of unspecified shoulder, not specified as traumatic: Secondary | ICD-10-CM

## 2012-09-14 DIAGNOSIS — M5412 Radiculopathy, cervical region: Secondary | ICD-10-CM

## 2012-09-14 DIAGNOSIS — G5600 Carpal tunnel syndrome, unspecified upper limb: Secondary | ICD-10-CM

## 2012-09-20 ENCOUNTER — Ambulatory Visit
Admission: RE | Admit: 2012-09-20 | Discharge: 2012-09-20 | Disposition: A | Discharge status: Home / Self Care | Payer: Enrolled Prime—HMO | Source: Ambulatory Visit | Attending: Physical Medicine & Rehabilitation | Admitting: Physical Medicine & Rehabilitation

## 2012-09-27 ENCOUNTER — Ambulatory Visit: Payer: Enrolled Prime—HMO

## 2012-09-27 ENCOUNTER — Ambulatory Visit: Payer: Enrolled Prime—HMO | Attending: Physical Medicine & Rehabilitation

## 2012-09-27 ENCOUNTER — Other Ambulatory Visit: Payer: Self-pay

## 2012-09-27 DIAGNOSIS — M19019 Primary osteoarthritis, unspecified shoulder: Secondary | ICD-10-CM | POA: Insufficient documentation

## 2012-09-27 DIAGNOSIS — M542 Cervicalgia: Secondary | ICD-10-CM

## 2012-09-27 DIAGNOSIS — M549 Dorsalgia, unspecified: Secondary | ICD-10-CM

## 2012-09-27 DIAGNOSIS — M249 Joint derangement, unspecified: Secondary | ICD-10-CM | POA: Insufficient documentation

## 2012-09-27 DIAGNOSIS — M67919 Unspecified disorder of synovium and tendon, unspecified shoulder: Secondary | ICD-10-CM | POA: Insufficient documentation

## 2012-09-27 DIAGNOSIS — M79606 Pain in leg, unspecified: Secondary | ICD-10-CM

## 2012-09-27 DIAGNOSIS — M79609 Pain in unspecified limb: Secondary | ICD-10-CM | POA: Insufficient documentation

## 2013-02-11 ENCOUNTER — Observation Stay
Admission: EM | Admit: 2013-02-11 | Discharge: 2013-02-12 | Disposition: A | Discharge status: Home / Self Care | Payer: Enrolled Prime—HMO | Attending: Internal Medicine | Admitting: Internal Medicine

## 2013-02-11 ENCOUNTER — Emergency Department: Payer: Enrolled Prime—HMO

## 2013-02-11 ENCOUNTER — Observation Stay: Payer: Enrolled Prime—HMO | Admitting: Internal Medicine

## 2013-02-11 DIAGNOSIS — F3289 Other specified depressive episodes: Secondary | ICD-10-CM | POA: Insufficient documentation

## 2013-02-11 DIAGNOSIS — I6529 Occlusion and stenosis of unspecified carotid artery: Secondary | ICD-10-CM | POA: Insufficient documentation

## 2013-02-11 DIAGNOSIS — R0789 Other chest pain: Principal | ICD-10-CM | POA: Insufficient documentation

## 2013-02-11 DIAGNOSIS — I959 Hypotension, unspecified: Secondary | ICD-10-CM

## 2013-02-11 DIAGNOSIS — R079 Chest pain, unspecified: Secondary | ICD-10-CM

## 2013-02-11 DIAGNOSIS — M549 Dorsalgia, unspecified: Secondary | ICD-10-CM | POA: Insufficient documentation

## 2013-02-11 DIAGNOSIS — G8929 Other chronic pain: Secondary | ICD-10-CM | POA: Insufficient documentation

## 2013-02-11 DIAGNOSIS — I10 Essential (primary) hypertension: Secondary | ICD-10-CM | POA: Insufficient documentation

## 2013-02-11 DIAGNOSIS — E041 Nontoxic single thyroid nodule: Secondary | ICD-10-CM | POA: Insufficient documentation

## 2013-02-11 DIAGNOSIS — Z87891 Personal history of nicotine dependence: Secondary | ICD-10-CM | POA: Insufficient documentation

## 2013-02-11 HISTORY — DX: Dorsalgia, unspecified: M54.9

## 2013-02-11 HISTORY — DX: Depression, unspecified: F32.A

## 2013-02-11 HISTORY — DX: Nontoxic single thyroid nodule: E04.1

## 2013-02-11 LAB — COMPREHENSIVE METABOLIC PANEL
ALT: 14 U/L (ref 0–55)
AST (SGOT): 14 U/L (ref 5–34)
Albumin/Globulin Ratio: 0.9 (ref 0.9–2.2)
Albumin: 3.7 g/dL (ref 3.5–5.0)
Alkaline Phosphatase: 89 U/L (ref 40–150)
Anion Gap: 13 (ref 5.0–15.0)
BUN: 14 mg/dL (ref 7.0–19.0)
Bilirubin, Total: 0.4 mg/dL (ref 0.2–1.2)
CO2: 22 mEq/L (ref 22–29)
Calcium: 9.5 mg/dL (ref 8.5–10.5)
Chloride: 104 mEq/L (ref 98–107)
Creatinine: 0.8 mg/dL (ref 0.6–1.0)
Globulin: 4.1 g/dL — ABNORMAL HIGH (ref 2.0–3.6)
Glucose: 95 mg/dL (ref 70–100)
Potassium: 3.9 mEq/L (ref 3.5–5.1)
Protein, Total: 7.8 g/dL (ref 6.0–8.3)
Sodium: 139 mEq/L (ref 136–145)

## 2013-02-11 LAB — CBC AND DIFFERENTIAL
Basophils Absolute Automated: 0.01 (ref 0.00–0.20)
Basophils Automated: 0 %
Eosinophils Absolute Automated: 0.04 (ref 0.00–0.70)
Eosinophils Automated: 0 %
Hematocrit: 41 % (ref 37.0–47.0)
Hgb: 14.1 g/dL (ref 12.0–16.0)
Lymphocytes Absolute Automated: 1.87 (ref 0.50–4.40)
Lymphocytes Automated: 18 %
MCH: 32.5 pg — ABNORMAL HIGH (ref 28.0–32.0)
MCHC: 34.4 g/dL (ref 32.0–36.0)
MCV: 94.5 fL (ref 80.0–100.0)
MPV: 9.1 fL — ABNORMAL LOW (ref 9.4–12.3)
Monocytes Absolute Automated: 1.24 — ABNORMAL HIGH (ref 0.00–1.20)
Monocytes: 12 %
Neutrophils Absolute: 7.41 (ref 1.80–8.10)
Neutrophils: 70 %
Platelets: 338 (ref 140–400)
RBC: 4.34 (ref 4.20–5.40)
RDW: 14 % (ref 12–15)
WBC: 10.57 (ref 3.50–10.80)

## 2013-02-11 LAB — TROPONIN I
Troponin I: 0.01 ng/mL (ref 0.00–0.09)
Troponin I: <0.01 ng/mL (ref 0.00–0.09)

## 2013-02-11 LAB — GFR: EGFR: >60

## 2013-02-11 LAB — LIPASE: Lipase: 20 U/L (ref 8–78)

## 2013-02-11 MED ORDER — CLONIDINE HCL 0.1 MG PO TABS
0.1000 mg | ORAL_TABLET | ORAL | Status: DC | PRN
Start: 2013-02-11 — End: 2013-02-12

## 2013-02-11 MED ORDER — ASPIRIN 81 MG PO CHEW
324.0000 mg | CHEWABLE_TABLET | Freq: Once | ORAL | Status: AC
Start: 2013-02-11 — End: 2013-02-11
  Administered 2013-02-11: 324 mg via ORAL
  Filled 2013-02-11: qty 4

## 2013-02-11 MED ORDER — ACETAMINOPHEN 325 MG PO TABS
650.0000 mg | ORAL_TABLET | ORAL | Status: DC | PRN
Start: 2013-02-11 — End: 2013-02-12

## 2013-02-11 MED ORDER — SENNA 8.6 MG PO TABS
17.2000 mg | ORAL_TABLET | Freq: Every day | ORAL | Status: DC | PRN
Start: 2013-02-11 — End: 2013-02-12

## 2013-02-11 MED ORDER — ASPIRIN 81 MG PO TBEC
81.0000 mg | DELAYED_RELEASE_TABLET | Freq: Every day | ORAL | Status: DC
Start: 2013-02-12 — End: 2013-02-12
  Administered 2013-02-12: 81 mg via ORAL
  Filled 2013-02-11: qty 1

## 2013-02-11 MED ORDER — NITROGLYCERIN 2 % TD OINT
0.5000 [in_us] | TOPICAL_OINTMENT | TRANSDERMAL | Status: DC
Start: 2013-02-12 — End: 2013-02-12

## 2013-02-11 MED ORDER — MORPHINE SULFATE 2 MG/ML IJ/IV SOLN (WRAP)
4.0000 mg | Status: DC | PRN
Start: 2013-02-11 — End: 2013-02-11

## 2013-02-11 MED ORDER — ONDANSETRON HCL 4 MG/2ML IJ SOLN
4.0000 mg | Freq: Once | INTRAMUSCULAR | Status: AC
Start: 2013-02-11 — End: 2013-02-11
  Administered 2013-02-11: 4 mg via INTRAVENOUS
  Filled 2013-02-11: qty 2

## 2013-02-11 MED ORDER — SODIUM CHLORIDE 0.9 % IV BOLUS
1000.0000 mL | Freq: Once | INTRAVENOUS | Status: AC
Start: 2013-02-11 — End: 2013-02-11
  Administered 2013-02-11: 1000 mL via INTRAVENOUS

## 2013-02-11 MED ORDER — MORPHINE SULFATE 2 MG/ML IJ/IV SOLN (WRAP)
4.0000 mg | Freq: Once | Status: AC
Start: 2013-02-11 — End: 2013-02-11
  Administered 2013-02-11: 4 mg via INTRAVENOUS
  Filled 2013-02-11: qty 2

## 2013-02-11 MED ORDER — HYDRALAZINE HCL 20 MG/ML IJ SOLN
10.0000 mg | Freq: Four times a day (QID) | INTRAMUSCULAR | Status: DC | PRN
Start: 2013-02-11 — End: 2013-02-12

## 2013-02-11 MED ORDER — ONDANSETRON HCL 4 MG/2ML IJ SOLN
4.0000 mg | Freq: Three times a day (TID) | INTRAMUSCULAR | Status: DC | PRN
Start: 2013-02-11 — End: 2013-02-11

## 2013-02-11 MED ORDER — CETIRIZINE HCL 1 MG/ML PO SYRP
2.5000 mg | ORAL_SOLUTION | Freq: Every day | ORAL | Status: DC
Start: 2013-02-11 — End: 2013-02-12
  Administered 2013-02-12: 2.5 mg via ORAL
  Filled 2013-02-11 (×2): qty 2.5

## 2013-02-11 MED ORDER — NITROGLYCERIN 2 % TD OINT
0.5000 [in_us] | TOPICAL_OINTMENT | Freq: Four times a day (QID) | TRANSDERMAL | Status: DC | PRN
Start: 2013-02-11 — End: 2013-02-12

## 2013-02-11 MED ORDER — ZOLPIDEM TARTRATE 5 MG PO TABS
5.0000 mg | ORAL_TABLET | Freq: Every evening | ORAL | Status: DC | PRN
Start: 2013-02-11 — End: 2013-02-12

## 2013-02-11 MED ORDER — DOCUSATE SODIUM 100 MG PO CAPS
100.0000 mg | ORAL_CAPSULE | Freq: Two times a day (BID) | ORAL | Status: DC | PRN
Start: 2013-02-11 — End: 2013-02-12

## 2013-02-11 MED ORDER — SODIUM CHLORIDE 0.9 % IJ SOLN
3.0000 mL | Freq: Three times a day (TID) | INTRAMUSCULAR | Status: DC
Start: 2013-02-11 — End: 2013-02-12
  Administered 2013-02-12 (×3): 3 mL via INTRAVENOUS

## 2013-02-11 MED ORDER — ACETAMINOPHEN 325 MG PO TABS
650.0000 mg | ORAL_TABLET | Freq: Four times a day (QID) | ORAL | Status: DC | PRN
Start: 2013-02-11 — End: 2013-02-11

## 2013-02-11 MED ORDER — MORPHINE SULFATE 2 MG/ML IJ/IV SOLN (WRAP)
2.0000 mg | Status: DC | PRN
Start: 2013-02-11 — End: 2013-02-12

## 2013-02-11 MED ORDER — HYDRALAZINE HCL 10 MG PO TABS
10.0000 mg | ORAL_TABLET | Freq: Four times a day (QID) | ORAL | Status: DC | PRN
Start: 2013-02-11 — End: 2013-02-12
  Filled 2013-02-11: qty 1

## 2013-02-11 MED ORDER — OXYCODONE-ACETAMINOPHEN 5-325 MG PO TABS
1.0000 | ORAL_TABLET | Freq: Four times a day (QID) | ORAL | Status: DC | PRN
Start: 2013-02-11 — End: 2013-02-12
  Administered 2013-02-11: 1 via ORAL
  Filled 2013-02-11: qty 1

## 2013-02-11 MED ORDER — ONDANSETRON HCL 4 MG/2ML IJ SOLN
4.0000 mg | Freq: Four times a day (QID) | INTRAMUSCULAR | Status: DC | PRN
Start: 2013-02-11 — End: 2013-02-12

## 2013-02-11 MED ORDER — POLYETHYLENE GLYCOL 3350 17 G PO PACK
17.0000 g | PACK | Freq: Every day | ORAL | Status: DC | PRN
Start: 2013-02-11 — End: 2013-02-12

## 2013-02-11 MED ORDER — NITROGLYCERIN 0.4 MG SL SUBL
0.4000 mg | SUBLINGUAL_TABLET | SUBLINGUAL | Status: AC
Start: 2013-02-11 — End: 2013-02-11
  Administered 2013-02-11 (×2): 0.4 mg via SUBLINGUAL
  Filled 2013-02-11: qty 1

## 2013-02-11 MED ORDER — NITROGLYCERIN 0.4 MG SL SUBL
0.4000 mg | SUBLINGUAL_TABLET | SUBLINGUAL | Status: DC | PRN
Start: 2013-02-11 — End: 2013-02-12

## 2013-02-11 MED ORDER — TAB-A-VITE/BETA CAROTENE PO TABS
1.0000 | ORAL_TABLET | Freq: Every day | ORAL | Status: DC
Start: 2013-02-11 — End: 2013-02-12
  Administered 2013-02-12: 1 via ORAL
  Filled 2013-02-11: qty 1

## 2013-02-11 NOTE — Plan of Care (Signed)
VTE/PE Risk Screening  Complete Upon Admission and Transfer to Different Level of Care  Completed by nurse: Doroteo Glassman 02/11/2013 6:22 PM   -----------------------------------------------------------------------------------------------------------  SECTION 1 - Risk Screening     []   Patient currently receiving anticoagulation therapy (Heparin, Lovenox, Coumadin, Pradaxa, Xarelto, or Arixtra Only) and received 1 dose within 24 hours of admission STOP HERE   []   VTE/PE prophylaxis currently prescribed elsewhere - STOP HERE   []   Comfort Care - STOP HERE   []   Clinical Trials - STOP HERE    Contraindications: Patients with a history of the following conditions cannot haveSequential compression devices (SCD     []  Any of these conditions present , Call MD for pharmacological prophylaxis or ask MD to document reason for not having both mechanical and pharmacologic VTE prophylaxis   []  Post-op vein ligation   []  Suspected VTE   []  Cellulitis/Dermatitis of the leg   []  Severe ischemic Vascular disease   []  Edema related to Congestive Heart Faliure   []  Gangrene   []  Recent skin graft  -----------------------------------------------------------------------------------------------------------  SECTION 2 - Risk Factors (Check all that apply)    Moderate Risk Factors   []   Heart Failure (current or history of)   []   Respiratory Failure   []   Acute Myocardial Infarction (AMI)   []   Acute Infection   []   Rheumatologic Disorder   []   Elderly age (52 years old)   []   Ongoing hormonal treatment / estrogen use (including Tamoxifen, Raloxifene)   []   Obesity (BMI >/= 30kg/m2)    High Risk Factors   []   Recent (</= 1 month) trauma/surgery    Highest Risk Factors   []   Active Cancer   []   Previous VTE   [x]   Reduced mobility (>24 hrs; current or anticipated)   []   Known thrombophilic condition (hematological disorders that promote thrombosis)      []   No boxes checked in this section indicate patient is at low risk for VTE. No VTE  Prophylaxis indicated.      * No surgery found * [x]   One or more risk factors present, enter an EPIC order for Sequential compression devices (SCD). Use per protocol, MD signature required.

## 2013-02-11 NOTE — Consults (Signed)
[facility] CONSULTATION REPORT  [facility]    Date Time: 02/11/2013 10:39 PM  Patient Name: Alicia Waters  Requesting Physician: Alicia Louis, DO       Reason for Consultation:   Chest Pain      History:   Alicia Waters is Waters 52 y.o. woman with no known history of heart disease admitted from the [facility] where she presented this aftetrnoon complaining of chest pain and off since around [place].  She describes the pain as sharp.  It is nor radiating and non pleuritic/  There was some associated nausea but no vomiting.  EKG on admission shows no acute changes.  Troponin's negative X2 thus far.    Risk factors include longstanding hypertension and Waters positive family history of CAD.  She also has smoked 1/2 ppd cigarettes for the past 30 years.Alicia Waters has no history of diabetes.    Other pertinent medical history includes GERD, ruptured cervical  spine surgery X3, and previously diagnosed thyroid nodule for which she underwent biopsy of Waters benign lesion     Married, lives with husband.  No regular exercise.  Does not drink..        Past Medical History:     Past Medical History   Diagnosis Date   . Hypertensive disorder    . Thyroid nodule    . Depression        Past Surgical History:     Past Surgical History   Procedure Date   . Sinus surgery    . Throat biiopsy    . Cesarean section    . Cervical fusion 01/2012       Family History:   History reviewed. No pertinent family history.    Social History:     History     Social History   . Marital Status: Married     Spouse Name: N/Waters     Number of Children: N/Waters   . Years of Education: N/Waters     Social History Main Topics   . Smoking status: Current Every Day Smoker -- 0.5 packs/day for 20 years     Types: Cigarettes   . Smokeless tobacco: Never Used   . Alcohol Use: No   . Drug Use: No   . Sexually Active:      Other Topics Concern   . Not on file     Social History Narrative   . No narrative on file       Allergies:   No Known  Allergies    Medications:     Prescriptions prior to admission   Medication Sig   . Black Cohosh 540 MG Cap Take by mouth 2 (two) times daily.   . fexofenadine (ALLEGRA) 30 MG tablet Take 30 mg by mouth daily.   Marland Kitchen ibuprofen (ADVIL,MOTRIN) 800 MG tablet Take 800 mg by mouth 2 (two) times daily as needed.   Marland Kitchen losartan-hydrochlorothiazide (HYZAAR) 100-25 MG per tablet Take 1 tablet by mouth daily.   . Multiple Vitamin (MULTIVITAMIN) capsule Take 1 capsule by mouth daily.   . Tapentadol HCl (NUCYNTA ER) 150 MG Tablet SR 12 hr Take by mouth 2 (two) times daily.       Current Facility-Administered Medications   Medication Dose Route Frequency Provider Last Rate Last Dose   . acetaminophen (TYLENOL) tablet 650 mg  650 mg Oral Q4H PRN Alicia Waters, Tahir A, DO       . [COMPLETED] aspirin chewable tablet 324 mg  324  mg Oral Once Alicia Shivers, MD   324 mg at 02/11/13 1417   . [START ON 02/12/2013] aspirin EC tablet 81 mg  81 mg Oral Daily Alicia Waters, Tahir A, DO       . cetirizine (ZyrTEC) 1 MG/ML syrup 2.5 mg  2.5 mg Oral Daily Alicia Waters, Tahir A, DO       . cloNIDine (CATAPRES) tablet 0.1 mg  0.1 mg Oral Q4H PRN Alicia Waters, Tahir A, DO       . docusate sodium (COLACE) capsule 100 mg  100 mg Oral Q12H PRN Alicia Waters, Tahir A, DO       . hydrALAZINE (APRESOLINE) injection 10 mg  10 mg Intravenous Q6H PRN Alicia Waters, Tahir A, DO       . hydrALAZINE (APRESOLINE) tablet 10 mg  10 mg Oral Q6H PRN Alicia Waters, Tahir A, DO       . morphine injection 2 mg  2 mg Intravenous Q4H PRN Alicia Waters, Tahir A, DO       . [COMPLETED] morphine injection 4 mg  4 mg Intravenous Once Alicia Shivers, MD   4 mg at 02/11/13 1427   . multivitamin (MULTIVITAMIN) tablet 1 tablet  1 tablet Oral Daily Alicia Waters, Tahir A, DO       . nitroglycerin (NITRO-BID) 2 % ointment 0.5 inch  0.5 inch Topical Q6H PRN Alicia Waters, Tahir A, DO       . [START ON 02/12/2013] nitroglycerin (NITRO-BID) 2 % ointment 0.5 inch  0.5 inch Topical Q6H HOLD MN Alicia Waters, Tahir A, DO       . [EXPIRED] nitroglycerin  (NITROSTAT) SL tablet 0.4 mg  0.4 mg Sublingual Q5 Min Alicia Waters, Vinod S, MD   0.4 mg at 02/11/13 1435   . nitroglycerin (NITROSTAT) SL tablet 0.4 mg  0.4 mg Sublingual Q5 Min PRN Alicia Shivers, MD       . [COMPLETED] ondansetron [facility]) injection 4 mg  4 mg Intravenous Once Alicia Shivers, MD   4 mg at 02/11/13 1443   . ondansetron (ZOFRAN) injection 4 mg  4 mg Intravenous Q6H PRN Alicia Waters, Tahir A, DO       . oxyCODONE-acetaminophen (PERCOCET) 5-325 MG per tablet 1 tablet  1 tablet Oral Q6H PRN Alicia Coffer A, DO   1 tablet at 02/11/13 1826   . polyethylene glycol (MIRALAX) packet 17 g  17 g Oral QD PRN Alicia Waters, Tahir A, DO       . Senna (SENOKOT) tablet 17.2 mg  17.2 mg Oral QD PRN Alicia Waters, Tahir A, DO       . sodium chloride (PF) 0.9 % flush 3 mL  3 mL Intravenous Q8H Alicia Waters, Tahir A, DO       . [COMPLETED] sodium chloride 0.9 % bolus 1,000 mL  1,000 mL Intravenous Once Alicia Shivers, MD   1,000 mL at 02/11/13 1443   . zolpidem (AMBIEN) tablet 5 mg  5 mg Oral QHS PRN Alicia Coffer A, DO       . [DISCONTINUED] acetaminophen (TYLENOL) tablet 650 mg  650 mg Oral Q6H PRN Alicia Waters, Tahir A, DO       . [DISCONTINUED] morphine injection 4 mg  4 mg Intravenous Q4H PRN Alicia Shivers, MD       . [DISCONTINUED] ondansetron [facility]) injection 4 mg  4 mg Intravenous Q8H PRN Alicia Shivers, MD             Review of Systems:    Comprehensive review of systems including  constitutional, eyes, ears, nose, mouth, throat, cardiovascular, GI, GU, musculoskeletal, integumentary, respiratory, neurologic, psychiatric, and endocrine is negative other than what is mentioned already in the history of present illness    Physical Exam:     Filed Vitals:    02/11/13 1916   BP: 135/75   Pulse: 64   Temp: 97.2 F (36.2 C)   Resp: 18   SpO2: 97%     Temp (24hrs), Avg:97.3 F (36.3 C), Min:96.3 F (35.7 C), Max:98.3 F (36.8 C)      Intake and Output Summary (Last 24 hours) at Date Time  No intake or output data in the 24 hours ending 02/11/13  2239    GENERAL: Patient is in no acute distress   HEENT: No scleral icterus or conjunctival pallor, moist mucous membranes   NECK: No jugular venous distention or thyromegaly, normal carotid upstroke.  Left carotid bruit.  CARDIAC: Normal apical impulse, regular rate and rhythm, with normal S1 and S2. Grade 2/6 apical systolic murmur. No rubs.   CHEST: Clear to auscultation bilaterally, normal respiratory effort  ABDOMEN: No abdominal bruits, masses, or hepatosplenomegaly, non tender, non-distended, good bowel sounds   EXTREMITIES: No clubbing, cyanosis, or edema, 2+ DP, PT, and femoral pulses bilaterally without bruits  SKIN: No rash or jaundice   NEUROLOGIC: Alert and oriented to time, place and person, normal mood and affect  MUSCULOSKELETAL: Normal muscle strength and tone.      Labs Reviewed:     Lab 02/11/13 2003 02/11/13 1406   CK -- --   TROPI <0.01 0.01   TROPT -- --   CKMBINDEX -- --     No results found for this basename: DIG in the last 168 hours  No results found for this basename: CHOL:3,TRIG:3,HDL:3,LDL:3 in the last 168 hours    Lab 02/11/13 1406   BILITOTAL 0.4   BILIDIRECT --   PROT 7.8   ALB 3.7   ALT 14   AST 14     No results found for this basename: MG in the last 168 hours  No results found for this basename: PT,INR,PTT in the last 168 hours    Lab 02/11/13 1406   WBC 10.57   HGB 14.1   HCT 41.0   PLT 338       Lab 02/11/13 1406   NA 139   K 3.9   CL 104   CO2 22   BUN 14.0   CREAT 0.8   EGFR >60.0   GLU 95   CA 9.5         .       Radiology   Radiological Procedure reviewed.      chest X-ray History: Left sided cp   Technique: PA and Lateral   Comparison: None.   Findings:   The lungs appear clear.   There is no pneumothorax.   The heart is normal in size. The mediastinum is within normal limits.   IMPRESSION:   No active disease is seen in the chest.   [name], MD     Assessment:    Atypical chest pain   Hypertension   Apical systolic murmur   Left carotid bruit   GERD   C spine  disease - s/p laminectomy X3.    Recommendations:    Stress nuclear study in AM   Echocardiogram   Carotid duplex   Check fasting lipids      Thanks much.  Signed by: Montey Hora, MD  [place]  NP [place] [phone] (8am-5pm)  MD [employer] [phone] (8am-5pm)  After hours, non urgent consult line [phone]  After Hours, urgent consults [phone]

## 2013-02-11 NOTE — ED Provider Notes (Signed)
EMERGENCY DEPARTMENT HISTORY AND PHYSICAL EXAM     Physician/Midlevel provider first contact with patient: 02/11/13 1355         Date: 02/11/2013  Patient Name: Alicia Waters    History of Presenting Illness     Chief Complaint   Patient presents with   . Chest Pain       History Provided By: pt    Chief Complaint: CP  Onset: 0500 this morning  Timing: worsened 1 hour ago  Location: midsternal to left sided  Quality: sharp  Severity: moderate  Modifying Factors: no relief with motrin 800mg , worse with palpation of left chest   Associated Symptoms: left arm pain.    Additional History: Alicia Waters is a 52 y.o. female with pmhx of HTN who is presenting to ED with non radiating midsternal to left sided CP. Pt describes a sharp pain that began at 0500 this morning and became more constant 1 hour ago with associated left arm pain. She took motrin 800mg  with minimal relief and sts pain is worse with palpation. Pt reports fhx of early CAD. She has never had a stess test, does not take hormone pills, no h/o DVT/PE, and no recent long travel but does smoke cigarettes. No family history of aortic dissection or AAA.    Pt denies rash, fever, cough, rhinorrhea. She also sts no drug use.    PCP: Alicia Waters., MD      Current Facility-Administered Medications   Medication Dose Route Frequency Provider Last Rate Last Dose   . acetaminophen (TYLENOL) tablet 650 mg  650 mg Oral Q4H PRN Shaikh, Tahir A, DO       . acetaminophen (TYLENOL) tablet 650 mg  650 mg Oral Q6H PRN Shaikh, Tahir A, DO       . [COMPLETED] aspirin chewable tablet 324 mg  324 mg Oral Once Maryella Shivers, MD   324 mg at 02/11/13 1417   . [START ON 02/12/2013] aspirin EC tablet 81 mg  81 mg Oral Daily Shaikh, Tahir A, DO       . cetirizine (ZyrTEC) 1 MG/ML syrup 2.5 mg  2.5 mg Oral Daily Shaikh, Tahir A, DO       . docusate sodium (COLACE) capsule 100 mg  100 mg Oral Q12H PRN Shaikh, Tahir A, DO       . morphine injection 2 mg  2 mg  Intravenous Q4H PRN Shaikh, Tahir A, DO       . [COMPLETED] morphine injection 4 mg  4 mg Intravenous Once Maryella Shivers, MD   4 mg at 02/11/13 1427   . morphine injection 4 mg  4 mg Intravenous Q4H PRN Maryella Shivers, MD       . multivitamin (MULTIVITAMIN) tablet 1 tablet  1 tablet Oral Daily Shaikh, Tahir A, DO       . nitroglycerin (NITRO-BID) 2 % ointment 0.5 inch  0.5 inch Topical Q6H PRN Shaikh, Tahir A, DO       . [EXPIRED] nitroglycerin (NITROSTAT) SL tablet 0.4 mg  0.4 mg Sublingual Q5 Min Virtie Bungert S, MD   0.4 mg at 02/11/13 1435   . nitroglycerin (NITROSTAT) SL tablet 0.4 mg  0.4 mg Sublingual Q5 Min PRN Maryella Shivers, MD       . [COMPLETED] ondansetron [facility]) injection 4 mg  4 mg Intravenous Once Maryella Shivers, MD   4 mg at 02/11/13 1443   . ondansetron (ZOFRAN) injection 4 mg  4 mg Intravenous Q6H PRN Shaikh, Tahir A, DO       . oxyCODONE-acetaminophen (PERCOCET) 5-325 MG per tablet 1 tablet  1 tablet Oral Q6H PRN Shaikh, Tahir A, DO       . polyethylene glycol (MIRALAX) packet 17 g  17 g Oral QD PRN Richardson Chiquito, Tahir A, DO       . Senna (SENOKOT) tablet 17.2 mg  17.2 mg Oral QD PRN Richardson Chiquito, Tahir A, DO       . sodium chloride (PF) 0.9 % flush 3 mL  3 mL Intravenous Q8H Shaikh, Tahir A, DO       . [COMPLETED] sodium chloride 0.9 % bolus 1,000 mL  1,000 mL Intravenous Once Maryella Shivers, MD   1,000 mL at 02/11/13 1443   . zolpidem (AMBIEN) tablet 5 mg  5 mg Oral QHS PRN Vedia Coffer A, DO       . [DISCONTINUED] ondansetron [facility]) injection 4 mg  4 mg Intravenous Q8H PRN Maryella Shivers, MD           Past History     Past Medical History:  Past Medical History   Diagnosis Date   . Hypertensive disorder    . Thyroid nodule    . Depression        Past Surgical History:  Past Surgical History   Procedure Date   . Sinus surgery    . Throat biiopsy    . Cesarean section    . Cervical fusion 01/2012       Family History:  History reviewed. No pertinent family history.    Social History:  History    Substance Use Topics   . Smoking status: Current Every Day Smoker -- 0.5 packs/day for 20 years     Types: Cigarettes   . Smokeless tobacco: Never Used   . Alcohol Use: No       Allergies:  No Known Allergies    Review of Systems     Review of Systems   Constitutional: Negative for fever.   HENT:        No rhinorrhea.   Eyes: Negative for redness.   Respiratory: Negative for cough.    Cardiovascular: Positive for chest pain.   Gastrointestinal: Negative for vomiting.   Musculoskeletal:        +left arm pain   Skin: Negative for rash.   Endo/Heme/Allergies:        NKDA   Psychiatric/Behavioral:        +tobacco use. No drug use.         Physical Exam   BP 151/87  Pulse 60  Temp 96.3 F (35.7 C)  Resp 18  Ht 1.6 m  Wt 81.194 kg  BMI 31.72 kg/m2  SpO2 95%    Constitutional: Vital signs reviewed. Well appearing. No distress.  Head: Normocephalic, atraumatic  Eyes: Conjunctiva and sclera are normal.  No injection or discharge.  Ears, Nose, Throat:  Normal external examination of the nose and ears.  Mucous membranes moist.  Neck: Normal range of motion. Supple, no meningeal signs. Trachea midline.  Respiratory/Chest: Clear to auscultation. No respiratory distress. (+) L chest tenderness to palpation.  Cardiovascular: Regular rate and rhythm. No murmurs.  Abdomen:  Bowel sounds intact. No rebound or guarding. Soft.  Non-tender.  Back: No cva tenderness to percussion.  Upper Extremity:  No edema. No cyanosis. Bilateral radial pulses intact and equal.   Lower Extremity:  No edema. No cyanosis. Bilateral calves symmetrical  and non-tender.   Skin: Warm and dry. No rash.  Neuro: A&Ox3. Moves all extremities spontaneously. Normal gait.   Psychiatric: Normal affect.  Normal insight.      Diagnostic Study Results     Labs -     Results     Procedure Component Value Units Date/Time    Comprehensive Metabolic Panel (CMP) [[id number]]  (Abnormal) Collected:02/11/13 1406    Specimen Information:Blood Updated:02/11/13 1438      Glucose 95 mg/dL      BUN 40.9 mg/dL      Creatinine 0.8 mg/dL      Sodium 811 mEq/L      Potassium 3.9 mEq/L      Chloride 104 mEq/L      CO2 22 mEq/L      CALCIUM 9.5 mg/dL      Protein, Total 7.8 g/dL      Albumin 3.7 g/dL      AST (SGOT) 14 U/L      ALT 14 U/L      Alkaline Phosphatase 89 U/L      Bilirubin, Total 0.4 mg/dL      Globulin 4.1 (H) g/dL      Albumin/Globulin Ratio 0.9      Anion Gap 13.0     Lipase [[id number]] Collected:02/11/13 1406    Specimen Information:Blood Updated:02/11/13 1438     Lipase 20 U/L     GFR [[id number]] Collected:02/11/13 1406     EGFR >60.0 Updated:02/11/13 1438    Troponin I [[id number]] Collected:02/11/13 1406    Specimen Information:Blood Updated:02/11/13 1435     Troponin I 0.01 ng/mL     CBC and differential [[id number]]  (Abnormal) Collected:02/11/13 1406    Specimen Information:Blood / Blood Updated:02/11/13 1414     WBC 10.57      RBC 4.34      Hgb 14.1 g/dL      Hematocrit 27.2 %      MCV 94.5 fL      MCH 32.5 (H) pg      MCHC 34.4 g/dL      RDW 14 %      Platelets 338      MPV 9.1 (L) fL      Neutrophils 70 %      Lymphocytes Automated 18 %      Monocytes 12 %      Eosinophils Automated 0 %      Basophils Automated 0 %      Immature Granulocyte Unmeasured %      Neutrophils Absolute 7.41      Abs Lymph Automated 1.87      Abs Mono Automated 1.24 (H)      Abs Eos Automated 0.04      Absolute Baso Automated 0.01      Absolute Immature Granulocyte Unmeasured           Radiologic Studies -   Radiology Results (24 Hour)     Procedure Component Value Units Date/Time    Chest 2 Views [[id number]] Collected:02/11/13 1429    Order Status:Completed  Updated:02/11/13 1433    Narrative:    History: L sided cp    Technique: PA and Lateral    Comparison: None.    Findings:  The lungs appear clear.  There is no pneumothorax.  The heart is normal in size.    The mediastinum is within normal limits.             Impression:     No active disease is  seen in the chest.    [name], MD    02/11/2013 2:29 PM      .      Medical Decision Making   I am the first provider for this patient.    I reviewed the vital signs, available nursing notes, past medical history, past surgical history, family history and social history.    Vital Signs-Reviewed the patient's vital signs.     Patient Vitals for the past 12 hrs:   BP Temp Pulse Resp   02/11/13 1752 151/87 mmHg 96.3 F (35.7 C) 60  18    02/11/13 1700 120/70 mmHg - 66  15    02/11/13 1531 122/79 mmHg - - -   02/11/13 1530 130/72 mmHg - 70  14    02/11/13 1501 116/64 mmHg - 71  17    02/11/13 1456 111/56 mmHg - 72  -   02/11/13 1451 105/56 mmHg - 75  14    02/11/13 1446 75/41 mmHg - 70  26    02/11/13 1443 65/33 mmHg - 60  18    02/11/13 1442 65/33 mmHg - 60  18    02/11/13 1434 128/72 mmHg - 80  16    02/11/13 1429 147/80 mmHg - 74  14    02/11/13 1356 171/87 mmHg 98.3 F (36.8 C) 84  16        Pulse Oximetry Analysis - Normal 97% on RA    Cardiac Monitor:  Rate: 68  Rhythm:  Normal Sinus Rhythm    EKG:  Interpreted by the EP.   Time Interpreted: 1335   Rate: 68   Rhythm: Normal Sinus Rhythm    Interpretation:no ST elevation   Comparison: Nonspecific changes when compared to 11/21/11 ekg.    Old Medical Records: Old medical records.  Previous electrocardiograms.  Nursing notes.     ED Course:   2:47 PM - reviewed all results with pt and recommended admission to [facility]. CP resolved after nitro, BP dropped so receiving a bolus.     2:54 PM - Discussed pt case with Dr. Letitia Neri, cardiology, who will consult.    2:56 PM - Discussed pt case with Dr. Adriana Simas, internal medicine, who accepts pt, will admit to Dr. Richardson Chiquito.      Provider Notes: Pt presenting with L sided chest pain which started today am. Pt has CAD risk factors of HTN, HL, DM, and family history of premature CAD. Initial troponin negative. Low suspicion for aortic dissection based on equal bilateral radial pulses, no mediastinal widening on CXR, and patient's description of symptoms. No pulsatile  abdominal mass or abdominal tenderness to suggest AAA. Wells score zero, PERC score one (for age), low suspicion for PE. No infectious complaints or risk factors to suggest pericarditis or endocarditis. No JVD, SOB, or cardiac enlargement to suggest tamponade. There could also be musculoskeletal aspect to his pain given that the L chest was tender, however pain also present without palpation. Pt admitted for further evaluation.      Core Measures:  Adult Chest Pain:  12-lead EKG was preformed in the ED. Aspirin was given.      Diagnosis     Clinical Impression:   1. Chest pain        _______________________________    Attestations:  This note is prepared by Claiborne Billings, acting as Scribe for Lynnea Ferrier, MD.    Lynnea Ferrier, MD: The scribe's documentation has been prepared under my direction and personally reviewed  by me in its entirety.  I confirm that the note above accurately reflects all work, treatment, procedures, and medical decision making performed by me.    _______________________________          Maryella Shivers, MD  02/11/13 [phone]

## 2013-02-11 NOTE — Plan of Care (Signed)
Severe Sepsis Screen  Date: 02/11/2013 Time: 6:23 PM  Nurse Signature: Alicia Waters Alicia Waters    A. Infection:    Does your patient have ONE or more of the following infection criteria?     []  Documented Infection - Does the patient have positive culture results (from blood,        sputum, urine, etc)?   []  Anti-Infective Therapy - Is the patient receiving antibiotic, antifungal, or other                anti-infective therapy?   []  Pneumonia - Is there documentation of pneumonia (X Ray, etc)?   []  WBC's - Have WBC's been found in normally sterile fluid (urine, CSF, etc.)?   []  Perforated Viscus -Does the patient have a perforated hollow organ (bowel)?    A.  Did you check any of the boxes above?    [x]  No  If No, Stop Here and Sepsis Screen Negative.               []  Yes, continue to section B      B. SIRS:     Does your patient have TWO or more of the following SIRS criteria (ensure vital signs & temperature are within 1 hour of this screening)?    []  Temperature - Is the patient's temperature: Temp: 96.3 F (35.7 C) (02/11/13 1752)   - Greater than or equal to 38.3 degrees C (greater than 100.9 degrees F)?   - Less than or equal to 36 degrees C (less than or equal to 96.8 degrees F)?    []  Heart Rate: Heart Rate: 60  (02/11/13 1752)   - Is the patient's heart rate greater than or equal to 90 bpm?    []  Respiratory: Resp Rate: 18  (02/11/13 1752)   - Is the patient's respiratory rate greater than or equal to 20?    []  WBC Count - Is the patient's WBC count:   Lab 02/11/13 1406   WBC 10.57      - Greater than or equal to 12,000/mm3 OR   - Less than or equal to 4,000/mm3 OR    - Are there greater than 10% immature neutrophils (bands)?    []  Glucose >140 without diabetes?   Lab 02/11/13 1406   GLU 95       []  Significant edema?    B.  Did you check two or more of the boxes above?     []  No, Stop Here and Sepsis Screen Negative   []  Yes, continue to section C    C.  Acute Organ Dysfunction     Does your patient have ONE or  more of the following organ dysfunction? (May need to wait for lab results for assessment - see below) Organ dysfunction must be a result of the sepsis not chronic conditions.    []  Cardiovascular - Does the patient have a: BP: 151/87 mmHg (02/11/13 1752)   -systolic Blood pressure less than or equal to 90 mmHg OR   -systolic blood pressure has dropped 40 mmHg or more from baseline OR   -mean arterial pressure less than or equal to 70 mmHg (for at least one hour   despite fluid resuscitation OR require vasopressor support?  []  Respiratory - Does the patient have new hypoxia defined by any of the following"   -A sustained increase in oxygen requirements by at least 2L/min on NC or 28%    FiO2 within the last 24 hrs OR   -  A persistent decrease in oxygen saturation of greater than or equal to 5% lasting   at least four or more hours and occurring within the last 24 hours  []  Renal - Does the patient have:   -low urine output (e.g. Less than 0.4mL/kg/HR for one hour despite adequate fluid    resuscitation, OR   -Increased creatinine (greater than 50% increase from baseline) OR   -require acute dialysis?  []  Hematologic - Does the patient have a:   -Low platelet count (less than 100,000 mm3)   Lab 02/11/13 1406   PLT 338   OR   -INR/aPTT greater than upper limit of normal?No results found for this basename: INR:1 in the last 168 hours or                No results found for this basename: APTT:1 in the last 168 hours  []  Metabolic - Does the patient have a high lactate (plasma lactate greater than or equal to 2.4 mMol/L)? No results found for this basename: LACTATE:5 in the last 168 hours    []  Hepatic - Are the patient's liver enzymes elevated (ALT greater than 72 IU/L or Total      Bilirubin greater than 2 MG/dL)?    Lab 02/11/13 1406   BILITOTAL 0.4   ALT 14     []  CNS - Does the patient have altered consciousness or reduced Glasgow Coma      Scale?    C.  Did you check any of the boxes above?     []  No, Sepsis Screen  Negative   []  YES:  A) Infection + B) SIRS + C) Organ Dysfunction = Positive Screen for Severe Sepsis     Notify MD and document in Complex Assessment under provider notification   - Name of physician notified:                                           - Date/Time Notifiied:                                             Document actions: Following must be completed within 1 hour of positive sepsis screen   []  Lactate drawn (if initial lactate > , repeat lactate in 2 hours for goal decrease 10-20%)   []  Blood Cultures obtained (prior to antibiotic administration; if not done within the last 48 hours)   []  Antibiotics initiated or modified    []   IV Fluid administered 0.9% NS __________ mLs given (Initial Bolus of 30 ml/kg if SBP < 90 or MAP < 65 or lactate greater than 4 mmol/dl)  Nursing Comments?:     _________________________________________________________________    Patients meeting the following criteria are excluded from screening (check if applicable):   []  [place] hypothermia protocol   []  Comfort Care orders   []  Surgery- No screening for 24 hours after surgery (48 hours after CV surgery)       -If Yes. Date of surgery:______________________   []  Admitted with sepsis and until 72 hours after admission with documented sepsis (RESUME SEPSIS SCREEN AFTER 72 hour window!!!)      -If Yes, Date of Documented Sepsis:______________________   []  Positive screen AND Completed sepsis bundle during previous 24 hours       -  If Yes, Date/Time of positive severe sepsis screen:_______________________

## 2013-02-11 NOTE — H&P (Signed)
Full note dictated #[id number]    Assessment :  Principal Problem:   *Chest pain  Active Problems:   HTN (hypertension)   Hypotension   Chronic back pain        Plan:  Serial enzyme  Cardiology consult   Resume home meds        Christophe Louis, DO

## 2013-02-11 NOTE — ED Notes (Addendum)
Patient c/o dizziness and nausea after second nitro and 4mg  morphine. MD notified. Zofran ordered, NS 1000 bolus started, patient placed in trendelenburg. Patient quickly reported feeling better and the lightheadedness subsided.

## 2013-02-11 NOTE — Plan of Care (Signed)
Problem: Chest Pain  Goal: Vital signs and cardiac rhythm stable  Outcome: Progressing  Arrived from [facility] via ambulance; SBP 150; placed on telemetry SR: HR 78; denies chest pain; denies SOB; pain to left side of the neck;

## 2013-02-11 NOTE — Treatment Plan (Signed)
Severe Sepsis Screen  Date: 02/11/2013 Time: 8:02 PM  Nurse Signature: Anne Fu A Jireh Vinas    A. Infection:    Does your patient have ONE or more of the following infection criteria?     []  Documented Infection - Does the patient have positive culture results (from blood,        sputum, urine, etc)?   []  Anti-Infective Therapy - Is the patient receiving antibiotic, antifungal, or other                anti-infective therapy?   []  Pneumonia - Is there documentation of pneumonia (X Ray, etc)?   []  WBC's - Have WBC's been found in normally sterile fluid (urine, CSF, etc.)?   []  Perforated Viscus -Does the patient have a perforated hollow organ (bowel)?    A.  Did you check any of the boxes above?    [x]  No  If No, Stop Here and Sepsis Screen Negative.               []  Yes, continue to section B      B. SIRS:     Does your patient have TWO or more of the following SIRS criteria (ensure vital signs & temperature are within 1 hour of this screening)?    []  Temperature - Is the patient's temperature: Temp: 97.2 F (36.2 C) (02/11/13 1916)   - Greater than or equal to 38.3 degrees C (greater than 100.9 degrees F)?   - Less than or equal to 36 degrees C (less than or equal to 96.8 degrees F)?    []  Heart Rate: Heart Rate: 64  (02/11/13 1916)   - Is the patient's heart rate greater than or equal to 90 bpm?    []  Respiratory: Resp Rate: 18  (02/11/13 1916)   - Is the patient's respiratory rate greater than or equal to 20?    []  WBC Count - Is the patient's WBC count:   Lab 02/11/13 1406   WBC 10.57      - Greater than or equal to 12,000/mm3 OR   - Less than or equal to 4,000/mm3 OR    - Are there greater than 10% immature neutrophils (bands)?    []  Glucose >140 without diabetes?   Lab 02/11/13 1406   GLU 95       []  Significant edema?    B.  Did you check two or more of the boxes above?     []  No, Stop Here and Sepsis Screen Negative   []  Yes, continue to section C    C.  Acute Organ Dysfunction     Does your patient have ONE or  more of the following organ dysfunction? (May need to wait for lab results for assessment - see below) Organ dysfunction must be a result of the sepsis not chronic conditions.    []  Cardiovascular - Does the patient have a: BP: 135/75 mmHg (02/11/13 1916)   -systolic Blood pressure less than or equal to 90 mmHg OR   -systolic blood pressure has dropped 40 mmHg or more from baseline OR   -mean arterial pressure less than or equal to 70 mmHg (for at least one hour   despite fluid resuscitation OR require vasopressor support?  []  Respiratory - Does the patient have new hypoxia defined by any of the following"   -A sustained increase in oxygen requirements by at least 2L/min on NC or 28%    FiO2 within the last 24 hrs OR   -  A persistent decrease in oxygen saturation of greater than or equal to 5% lasting   at least four or more hours and occurring within the last 24 hours  []  Renal - Does the patient have:   -low urine output (e.g. Less than 0.52mL/kg/HR for one hour despite adequate fluid    resuscitation, OR   -Increased creatinine (greater than 50% increase from baseline) OR   -require acute dialysis?  []  Hematologic - Does the patient have a:   -Low platelet count (less than 100,000 mm3)   Lab 02/11/13 1406   PLT 338   OR   -INR/aPTT greater than upper limit of normal?No results found for this basename: INR:1 in the last 168 hours or                No results found for this basename: APTT:1 in the last 168 hours  []  Metabolic - Does the patient have a high lactate (plasma lactate greater than or equal to 2.4 mMol/L)? No results found for this basename: LACTATE:5 in the last 168 hours    []  Hepatic - Are the patient's liver enzymes elevated (ALT greater than 72 IU/L or Total      Bilirubin greater than 2 MG/dL)?    Lab 02/11/13 1406   BILITOTAL 0.4   ALT 14     []  CNS - Does the patient have altered consciousness or reduced Glasgow Coma      Scale?    C.  Did you check any of the boxes above?     []  No, Sepsis Screen  Negative   []  YES:  A) Infection + B) SIRS + C) Organ Dysfunction = Positive Screen for Severe Sepsis     Notify MD and document in Complex Assessment under provider notification   - Name of physician notified:                                           - Date/Time Notifiied:                                             Document actions: Following must be completed within 1 hour of positive sepsis screen   []  Lactate drawn (if initial lactate > , repeat lactate in 2 hours for goal decrease 10-20%)   []  Blood Cultures obtained (prior to antibiotic administration; if not done within the last 48 hours)   []  Antibiotics initiated or modified    []   IV Fluid administered 0.9% NS __________ mLs given (Initial Bolus of 30 ml/kg if SBP < 90 or MAP < 65 or lactate greater than 4 mmol/dl)  Nursing Comments?:     _________________________________________________________________    Patients meeting the following criteria are excluded from screening (check if applicable):   []  [place] hypothermia protocol   []  Comfort Care orders   []  Surgery- No screening for 24 hours after surgery (48 hours after CV surgery)       -If Yes. Date of surgery:______________________   []  Admitted with sepsis and until 72 hours after admission with documented sepsis (RESUME SEPSIS SCREEN AFTER 72 hour window!!!)      -If Yes, Date of Documented Sepsis:______________________   []  Positive screen AND Completed sepsis bundle during previous 24 hours       -  If Yes, Date/Time of positive severe sepsis screen:_______________________

## 2013-02-12 ENCOUNTER — Inpatient Hospital Stay: Payer: Enrolled Prime—HMO

## 2013-02-12 LAB — ECG 12-LEAD
Atrial Rate: 68 {beats}/min
P Axis: 66 degrees
P-R Interval: 140 ms
Q-T Interval: 414 ms
QRS Duration: 76 ms
QTC Calculation (Bezet): 440 ms
R Axis: 44 degrees
T Axis: 48 degrees
Ventricular Rate: 68 {beats}/min

## 2013-02-12 LAB — MAGNESIUM: Magnesium: 2.1 mg/dL (ref 1.6–2.6)

## 2013-02-12 LAB — CBC AND DIFFERENTIAL
Basophils Absolute Automated: 0.01 (ref 0.00–0.20)
Basophils Automated: 0 %
Eosinophils Absolute Automated: 0.05 (ref 0.00–0.70)
Eosinophils Automated: 0 %
Hematocrit: 42.2 % (ref 37.0–47.0)
Hgb: 14 g/dL (ref 12.0–16.0)
Immature Granulocytes Absolute: 0.01
Immature Granulocytes: 0 %
Lymphocytes Absolute Automated: 2.07 (ref 0.50–4.40)
Lymphocytes Automated: 22 %
MCH: 32 pg (ref 28.0–32.0)
MCHC: 33.2 g/dL (ref 32.0–36.0)
MCV: 96.3 fL (ref 80.0–100.0)
MPV: 9.7 fL (ref 9.4–12.3)
Monocytes Absolute Automated: 0.91 (ref 0.00–1.20)
Monocytes: 10 %
Neutrophils Absolute: 6.48 (ref 1.80–8.10)
Neutrophils: 68 %
Nucleated RBC: 0 (ref 0–1)
Platelets: 316 (ref 140–400)
RBC: 4.38 (ref 4.20–5.40)
RDW: 14 % (ref 12–15)
WBC: 9.52 (ref 3.50–10.80)

## 2013-02-12 LAB — LIPID PANEL
Cholesterol / HDL Ratio: 6
Cholesterol: 198 mg/dL (ref 0–199)
HDL: 33 mg/dL — ABNORMAL LOW (ref >40)
LDL Calculated: 147 mg/dL — ABNORMAL HIGH (ref 0–99)
Triglycerides: 91 mg/dL (ref 34–149)
VLDL Calculated: 18 mg/dL (ref 10–40)

## 2013-02-12 LAB — GFR: EGFR: >60

## 2013-02-12 LAB — BASIC METABOLIC PANEL
Anion Gap: 12 (ref 5.0–15.0)
BUN: 13 mg/dL (ref 7.0–19.0)
CO2: 24 mEq/L (ref 22–29)
Calcium: 9.7 mg/dL (ref 8.5–10.5)
Chloride: 104 mEq/L (ref 98–107)
Creatinine: 0.9 mg/dL (ref 0.6–1.0)
Glucose: 103 mg/dL — ABNORMAL HIGH (ref 70–100)
Potassium: 4.2 mEq/L (ref 3.5–5.1)
Sodium: 140 mEq/L (ref 136–145)

## 2013-02-12 LAB — HEMOLYSIS INDEX
Hemolysis Index: 16 (ref 0–18)
Hemolysis Index: 13 — ABNORMAL HIGH (ref 0–9)

## 2013-02-12 LAB — PHOSPHORUS: Phosphorus: 4.2 mg/dL (ref 2.3–4.7)

## 2013-02-12 LAB — TROPONIN I: Troponin I: <0.01 ng/mL (ref 0.00–0.09)

## 2013-02-12 MED ORDER — ATORVASTATIN CALCIUM 10 MG PO TABS
10.0000 mg | ORAL_TABLET | Freq: Every evening | ORAL | Status: DC
Start: 2013-02-12 — End: 2013-02-12

## 2013-02-12 MED ORDER — TECHNETIUM TC 99M TETROFOSMIN INJECTION
1.0000 | Freq: Once | Status: AC | PRN
Start: 2013-02-12 — End: 2013-02-12
  Administered 2013-02-12: 1 via INTRAVENOUS

## 2013-02-12 MED ORDER — LOSARTAN POTASSIUM 25 MG PO TABS
50.0000 mg | ORAL_TABLET | Freq: Once | ORAL | Status: AC
Start: 2013-02-12 — End: 2013-02-12
  Administered 2013-02-12: 50 mg via ORAL
  Filled 2013-02-12: qty 2

## 2013-02-12 MED ORDER — ATORVASTATIN CALCIUM 40 MG PO TABS
40.0000 mg | ORAL_TABLET | Freq: Every evening | ORAL | Status: DC
Start: 2013-02-12 — End: 2014-12-09

## 2013-02-12 MED ORDER — ASPIRIN EC 81 MG PO TBEC
81.0000 mg | DELAYED_RELEASE_TABLET | Freq: Every day | ORAL | Status: DC
Start: 2013-02-12 — End: 2014-05-26

## 2013-02-12 NOTE — H&P (Signed)
Patient Type: I     ATTENDING PHYSICIAN: Vedia Coffer, DO     CHIEF COMPLAINT:  Chest pain.     HISTORY OF PRESENT ILLNESS:  A 52 year old African American female with past medical history of  hypertension, chronic back pain, degenerative disk disease, history of  cervical fusion, and diskectomy, presented with complaint of chest pain at  the [facility].  The patient stated that she has been having  intermittent chest pain for the past 1 month.  She has seen her primary  care physician at [facility], Dr. Gearlean Alf, on November 13, and at that time,  she had an EKG done, which shows normal sinus rhythm and prolonged QT  interval.  Patient was told that if the chest pain comes, she needs to be  seen in the emergency.  She woke up this morning with chest pain around 5  a.m., which worsened over 1 hour.  She took Motrin 800 mg with no relief.   The pain subsided later on and then came back again around 1:00 in the  evening and went to the [facility].  At the [facility], the  patient was noted to have a blood pressure 171/87.  She was given  sublingual nitroglycerin and then the blood pressure dropped to 65/33.   Patient received normal saline 1 liter.  Blood pressure came up after that  and patient was admitted.  She also received aspirin 324 mg in the ED at  [facility].  Cardiology consulted, Dr. Letitia Neri.  The patient at the time  of my examination still had some pain, which she is rating 5/10.  Initially  it was 8/10 with shortness of breath, along with nausea and diaphoresis  with radiation to the left arm.  She mentioned that she had chest pain  before and initially she thought this was because of the reflux and she  tried liquid Zantac before, which initially gave the relief, but later on,  the pain came back.  The patient has been having episodes of intermittent  chest pain for the past 1 month.     PAST MEDICAL HISTORY:  History of hypertension, depression, history of degenerative disk  disease,  chronic low back pain.     PAST SURGICAL HISTORY:  History of C-section, history of cervical fusion with anterior cervical  diskectomy and fusion.  History of sinus surgery.     SOCIAL HISTORY:  Tobacco, half pack per day for the last 40 years.  No alcohol, no illicit  drug use.     FAMILY HISTORY:  Grandmother had heart problem and diabetes.  Mother with brain tumor.   Father with lung cancer.  Sister with breast cancer.  No history of  coronary artery disease, as per patient.      HOME MEDICATIONS:  Includes:  Motrin 800 mg twice daily as needed, multivitamin once daily,  Allegra 30 mg daily, losartan with hydrochlorothiazide 100/25 mg once  daily, Nucynta 150 mg twice daily.     ALLERGIES:  The patient has no known drug allergies.     REVIEW OF SYSTEMS:  CONSTITUTIONAL:  Negative for fever or chills.  HEENT:  No rhinorrhea, no blurred vision.  RESPIRATORY:  Negative for cough.  CARDIOVASCULAR:  Positive for chest pain and shortness of breath.  GASTROINTESTINAL:  Positive for nausea.  No vomiting, no abdominal pain.  MUSKULOSKELETAL:  Positive for the left arm pain, radiates from the chest.  SKIN:  Negative for rash.    NEUROLOGIC:  Negative for numbness or weakness.    The rest of the review of systems is negative.     PHYSICAL EXAMINATION:  VITAL SIGNS:  At the time of arrival at the [facility], blood  pressure 171/87 with heart rate 84 and dropped to 65/33.  At the time of my  examination, blood pressure 135/75, heart rate 54, respirations 18,  temperature 97.2, oxygen saturation 97%.  GENERAL:  The patient is well developed, well nourished, in mild distress  secondary to pain.  HEENT:  Normocephalic, atraumatic.  Pupils equal, reacting to light  bilaterally.  Nasopharynx clear.  Hearing intact.  Oropharynx clear.   Mucous membranes moist.  NECK:  Supple, no JVD, no lymphadenopathy, no carotid bruit.  LUNGS:  Clear to auscultation bilaterally, no wheeze noted.  CARDIAC:  Normal S1, S2, regular  rhythm.  ABDOMEN:  Soft, bowel sounds positive.  EXTREMITIES:  No clubbing, cyanosis, or edema.  NEUROLOGIC:  The patient is alert, awake, oriented x3.  Cranial nerves II  through XII grossly intact.  No obvious gross motor or sensory deficit.     LABORATORY DATA:  Reveals white count of 10.5, hemoglobin 14.1, hematocrit 41, platelets 58.   Chemistry:  Sodium 139, potassium 3.9, chloride 104, bicarbonate 22, BUN  14, creatinine 0.8, glucose 95, calcium 9.5, AST 14, ALT 14, alkaline  phosphatase 89, total bilirubin 0.4, total protein 7.8, albumin 3.7,  globulin 4.1, lipase 28.  Troponin 0.01, repeat was less than 0.01.     DIAGNOSTIC DATA:   Chest x-ray with no active disease seen in the chest.  EKG as per the ED  physician note, because I could not find the EKG:  Rate of 58 with normal  sinus rhythm, no ST elevations noted.     ASSESSMENT AND PLAN:  A 52 year old female presented with chest pain.  1.  Chest pain.  We will do serial cardiac enzymes.  Cardiology consulted.   Keep the patient n.p.o. after midnight for possible stress test.  2.  History of hypertension.  The patient's blood pressure dropped and then  back to normal now.  We will monitor the blood pressure.  We will give the  medication with holding parameter.  3.  Chronic back pain.  Continue patient's home medications with Nucynta.    4.  Prophylaxis for DVT with Lovenox.     R1: bb 02/12/2013           D:  02/11/2013 21:47 PM by Dr. Signa Kell. [place], [place] [id number])  T:  02/11/2013 22:23 PM by       Everlean Cherry: [id number]) (Doc ID: [id number])

## 2013-02-12 NOTE — Progress Notes (Signed)
Thallium showed normal perfusion, LVEF 58%.  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  Would need clinical and echocardiographic follow up (cardiology follow up).  Reviewed with Dr. Marland Mcalpine.  Patient has military cardiology follow up planned.  Will go with copies of EKG, thallium, and echo results.    Karle Starch, MD

## 2013-02-12 NOTE — Discharge Instructions (Signed)
Chest Pain, Uncertain Cause  Chest pain can happen for a number of reasons. Sometimes the cause can not be determined. If yourcondition does not seem serious, and your pain does not appear to be coming from your heart, your doctor may recommend watching it closely. Sometimes the signs of a serious problem take more time to appear. Therefore, watch for the warning signs listed below.  Home care  After your visit, follow these recommendations:   Rest today and avoid strenuous activity.   Take any prescribed medicine as directed.  Follow-up care  Follow up with your doctor or this facility as instructed or if you do not start to feel better within 24 hours.  Call 911  Get immediate medical attention if any of the following occur:   A change in the type of pain: if it feels different, becomes more severe, lasts longer, or begins to spread into your shoulder, arm, neck, jaw or back   Shortness of breath or increased pain with breathing   Weakness, dizziness, or fainting   Rapid heart beat  Get prompt medical ttention  Call your doctor right away if any of the following occur:   Cough with dark colored sputum (phlegm) or blood   Fever of 100.13F(38C) or higher, or as directed by your health care provider   Swelling, pain or redness in one leg   [address], [address] [place], [place] [address]. All rights reserved. This information is not intended as a substitute for professional medical care. Always follow your healthcare professional's instructions.          Date Time: 02/12/2013 4:32 PM  Attending Physician: Christophe Louis, DO    Date of Admission:   02/11/2013    Reason for Admission:   Chest pain [786.50]  Chest pain    Follow up:        Neurology (If Stroke): ______________________  Phone:______________________    Other:___________________________________   Phone: ______________________       Medications:    Your medications have been listed for you on the Medication Reconciliation  Discharge Home List. Please bring a copy of all discharge instructions, including your Medication Reconciliation Discharge Home List when you visit your physician.      Continue taking all medications even if you feel well, unless otherwise instructed by physician.    Do not take any over-the-counter medications or herbal supplements without checking with your pharmacist or doctor.       If you are taking Warfarin, please follow up with (health professional/clinic) ____________ on _____________to have your PT/INR blood test checked.   Activity:    Rise slowly from a sitting or lying position. Increase activity slowly, unless otherwise instructed by physician.    Perform exercises as desginated by Therapist and Physician.    In the event of severe shortness of breath or chest discomfort, call 911. Do NOT drive to the hospital.    Speak with your physician regarding specific driving and/or work restrictions.    Diet:    If you have special diet orders, you have been given printed diet instructions.   ________________________________________________________________________    Tobacco Cessation Counseling:  If you are currently a tobacco user or have used tobacco within the last 12 months, we have provided you with written Tobacco Cessation Counseling.  ________________________________________________________________________    Heart Failure Education:  If you have a diagnosis of a Heart Failure, we have provided you with written Heart Failure Education.  Weigh yourself once a day at the same time. Record and bring the weight record to your next physician appointment.     Call your doctor if you gain more than 3 pounds in one day or 5 pounds in one week, or if you experience shortness of breath, leg swelling and/or chest discomfort.     Enroll in the  [facility] Tel-Assurance Program, a heart failure patient support program. Call [phone] for additional details.    ________________________________________________________________________    Diamond [facility] Education:    Call 911 for:    Sudden numbness or weakness of the face    Sudden numbness of the arm or leg especially one side of the body    Sudden confusion, trouble speaking or understanding    Sudden trouble seeing in one or both eyes    Sudden trouble walking or dizziness, loss of balance or coordination        For promotion of your health, we have provided you with personalized written education on risk factors specific to your diagnosis, including but not limited to:    High Blood Pressure    High Cholesterol    Atrial Fibrillation    Overweight    Diabetes    Smoking         Vaccinations  Pneumonia Vaccine Received on:  Flu Vaccine Received on:             Treatments/Special Instructions:                 Signed by: Angelica Chessman, RN    I HAVE RECEIVED AND UNDERSTAND THESE DISCHARGE INSTRUCTIONS.

## 2013-02-12 NOTE — Treatment Plan (Signed)
Rounded with Dr Richardson Chiquito      Today's subjective data:  Pt denies chest pain at this time.  Pt is alert and awake with stable vitals      Today's Plan:  Awaiting results from stress test, echo, and carotid doppler.  If results negative pt will d/c home.  Pt to get one time dose of 50 mg of losarten.        Expected discharge date: 02/12/2013        Patient Active Problem List   Diagnosis   . HTN (hypertension)   . Chest pain   . Hypotension   . Chronic back pain     Past Medical History   Diagnosis Date   . Hypertensive disorder    . Thyroid nodule    . Depression

## 2013-02-12 NOTE — Progress Notes (Signed)
Pt received stress test, echo and carotid doppler.  Doppler showed 50% narrowing of the arteries.  No other significant results in the other tests found.  Pt cleared by Dr Richardson Chiquito to d/c home.  Pt given discharge teaching and education.  Pt is to start lipitor.  Pt is to follow up with PCP and cardiologist.  Pt verbalized understanding of discharge instructions.  Peripheral IV d/c intact.  Tele box removed.  Pt d/c home with husband.

## 2013-02-12 NOTE — Plan of Care (Signed)
Severe Sepsis Screen  Date: 02/12/2013 Time: 8:20 AM  Nurse Signature: Alicia Waters    A. Infection:    Does your patient have ONE or more of the following infection criteria?     []  Documented Infection - Does the patient have positive culture results (from blood,        sputum, urine, etc)?   []  Anti-Infective Therapy - Is the patient receiving antibiotic, antifungal, or other                anti-infective therapy?   []  Pneumonia - Is there documentation of pneumonia (X Ray, etc)?   []  WBC's - Have WBC's been found in normally sterile fluid (urine, CSF, etc.)?   []  Perforated Viscus -Does the patient have a perforated hollow organ (bowel)?    A.  Did you check any of the boxes above?    [x]  No  If No, Stop Here and Sepsis Screen Negative.               []  Yes, continue to section B      B. SIRS:     Does your patient have TWO or more of the following SIRS criteria (ensure vital signs & temperature are within 1 hour of this screening)?    []  Temperature - Is the patient's temperature: Temp: 95.9 F (35.5 C) (02/12/13 0744)   - Greater than or equal to 38.3 degrees C (greater than 100.9 degrees F)?   - Less than or equal to 36 degrees C (less than or equal to 96.8 degrees F)?    []  Heart Rate: Heart Rate: 65  (02/12/13 0744)   - Is the patient's heart rate greater than or equal to 90 bpm?    []  Respiratory: Resp Rate: 17  (02/12/13 0744)   - Is the patient's respiratory rate greater than or equal to 20?    []  WBC Count - Is the patient's WBC count:   Lab 02/12/13 0324   WBC 9.52      - Greater than or equal to 12,000/mm3 OR   - Less than or equal to 4,000/mm3 OR    - Are there greater than 10% immature neutrophils (bands)?    []  Glucose >140 without diabetes?   Lab 02/12/13 0324   GLU 103*       []  Significant edema?    B.  Did you check two or more of the boxes above?     []  No, Stop Here and Sepsis Screen Negative   []  Yes, continue to section C    C.  Acute Organ Dysfunction     Does your patient have ONE or more  of the following organ dysfunction? (May need to wait for lab results for assessment - see below) Organ dysfunction must be a result of the sepsis not chronic conditions.    []  Cardiovascular - Does the patient have a: BP: 112/72 mmHg (02/12/13 0744)   -systolic Blood pressure less than or equal to 90 mmHg OR   -systolic blood pressure has dropped 40 mmHg or more from baseline OR   -mean arterial pressure less than or equal to 70 mmHg (for at least one hour   despite fluid resuscitation OR require vasopressor support?  []  Respiratory - Does the patient have new hypoxia defined by any of the following"   -A sustained increase in oxygen requirements by at least 2L/min on NC or 28%    FiO2 within the last 24 hrs OR   -  A persistent decrease in oxygen saturation of greater than or equal to 5% lasting   at least four or more hours and occurring within the last 24 hours  []  Renal - Does the patient have:   -low urine output (e.g. Less than 0.22mL/kg/HR for one hour despite adequate fluid    resuscitation, OR   -Increased creatinine (greater than 50% increase from baseline) OR   -require acute dialysis?  []  Hematologic - Does the patient have a:   -Low platelet count (less than 100,000 mm3)   Lab 02/12/13 0324   PLT 316   OR   -INR/aPTT greater than upper limit of normal?No results found for this basename: INR:1 in the last 168 hours or                No results found for this basename: APTT:1 in the last 168 hours  []  Metabolic - Does the patient have a high lactate (plasma lactate greater than or equal to 2.4 mMol/L)? No results found for this basename: LACTATE:5 in the last 168 hours    []  Hepatic - Are the patient's liver enzymes elevated (ALT greater than 72 IU/L or Total      Bilirubin greater than 2 MG/dL)?    Lab 02/11/13 1406   BILITOTAL 0.4   ALT 14     []  CNS - Does the patient have altered consciousness or reduced Glasgow Coma      Scale?    C.  Did you check any of the boxes above?     []  No, Sepsis Screen  Negative   []  YES:  A) Infection + B) SIRS + C) Organ Dysfunction = Positive Screen for Severe Sepsis     Notify MD and document in Complex Assessment under provider notification   - Name of physician notified:                                           - Date/Time Notifiied:                                             Document actions: Following must be completed within 1 hour of positive sepsis screen   []  Lactate drawn (if initial lactate > , repeat lactate in 2 hours for goal decrease 10-20%)   []  Blood Cultures obtained (prior to antibiotic administration; if not done within the last 48 hours)   []  Antibiotics initiated or modified    []   IV Fluid administered 0.9% NS __________ mLs given (Initial Bolus of 30 ml/kg if SBP < 90 or MAP < 65 or lactate greater than 4 mmol/dl)  Nursing Comments?:     _________________________________________________________________    Patients meeting the following criteria are excluded from screening (check if applicable):   []  [place] hypothermia protocol   []  Comfort Care orders   []  Surgery- No screening for 24 hours after surgery (48 hours after CV surgery)       -If Yes. Date of surgery:______________________   []  Admitted with sepsis and until 72 hours after admission with documented sepsis (RESUME SEPSIS SCREEN AFTER 72 hour window!!!)      -If Yes, Date of Documented Sepsis:______________________   []  Positive screen AND Completed sepsis bundle during previous 24 hours       -  If Yes, Date/Time of positive severe sepsis screen:_______________________

## 2013-02-12 NOTE — Plan of Care (Signed)
Problem: Safety  Goal: Patient will be free from injury during hospitalization  Outcome: Progressing  Hourly rounding and fall precaution maintained. Call light within reach, instructed pt to call for assistance.    Problem: Chest Pain  Goal: Vital signs and cardiac rhythm stable  Outcome: Progressing  SR/SB on tele monitor. Stable vital signs, afebrile. Pt denies SOB, c/o neck pain 2/10, cardiologist made aware. NPO for nuclear stress test in AM.

## 2013-02-12 NOTE — Discharge Summary (Signed)
DISCHARGE SUMMARY    Date Time: 02/12/2013 4:29 PM  Patient Name: Alicia Waters  Attending Physician: Christophe Louis, DO    Date of Admission:   02/11/2013    Date of Discharge:   02/12/2013    Reason for Admission:   Chest pain [786.50]  Chest pain    Discharge Dx:     Patient Active Problem List   Diagnosis   . HTN (hypertension)   . Chest pain- atypical - lexiscan negative     Left common carotid artery stenosis - 50%   . Hyperlipidemia    . Chronic back pain     Consultations:   Montey Hora, MD    Discharge Medications:        Medication List       As of 02/12/2013  4:29 PM      START taking these medications           aspirin 81 MG EC tablet    Take 1 tablet (81 mg total) by mouth daily.        atorvastatin 40 MG tablet    Commonly known as: LIPITOR    Take 1 tablet (40 mg total) by mouth nightly.         CONTINUE taking these medications           Black Cohosh 540 MG Caps        fexofenadine 30 MG tablet    Commonly known as: ALLEGRA        losartan-hydrochlorothiazide 100-25 MG per tablet    Commonly known as: HYZAAR        multivitamin capsule        NUCYNTA ER 150 MG Tb12    Generic drug: Tapentadol HCl         STOP taking these medications           ibuprofen 800 MG tablet    Commonly known as: ADVIL,MOTRIN               Where to get your medications     These are the prescriptions that you need to pick up.    You may get these medications from any pharmacy.           aspirin 81 MG EC tablet    atorvastatin 40 MG tablet                     Hospital Course:   HOSPITAL COURSE:  Please see the dictated H and P and consult for details.  Briefly, patient  is a 52 year old female who presented with chest pain and admitted.  Serial  enzyme was done, which was negative.  The patient was seen by cardiology  and had an echocardiogram done as well as a stress test, which was negative  and cleared by cardiology for discharge.  The patient was noted to have  elevated LDL cholesterol and given the  prescription for Lipitor along with  aspirin.  The patient's other issues remained stable.  The patient needs to  follow up with her primary care physician and also with her cardiologist at  [facility].    Laboratory Data     CBC    Lab 02/12/13 0324 02/11/13 1406   WBC 9.52 10.57   HGB 14.0 14.1   HCT 42.2 41.0   PLT 316 338   MCV 96.3 94.5   NEUTRO 68 70       CMP  Lab 02/12/13 0324 02/11/13 1406   NA 140 139   K 4.2 3.9   CL 104 104   CO2 24 22   BUN 13.0 14.0   CREAT 0.9 0.8   GLU 103* 95   CA 9.7 9.5   MG 2.1 --   PHOS 4.2 --   PROT -- 7.8   ALB -- 3.7   AST -- 14   ALT -- 14   ALKPHOS -- 89   BILITOTAL -- 0.4       Lipid panel  Lab Results   Component Value Date/Time    CHOL 198 02/12/2013  3:24 AM    TRIG 91 02/12/2013  3:24 AM    HDL 33* 02/12/2013  3:24 AM    LDL 147* 02/12/2013  3:24 AM     Cardiac enzymes    Lab 02/12/13 0324 02/11/13 2003 02/11/13 1406   CK -- -- --   TROPI <0.01 <0.01 0.01   TROPT -- -- --   CKMBINDEX -- -- --     All radiology result for current encounter  Echocardiogram Adult Complete W Clr/ Dopp Waveform  12-Feb-2013      IMPRESSIONS: Normal LV size and systolic function. Estimated LVEF 65%. No LVH. Mitral valve leaflets appear "teathered" with reduced opening. Moderate leaflet thickening. Mild mitral annular calcification without significant leaflet calcification. Mitral valve subvalvular apparatus does not appear fused. Moderate mitral stenosis is present with calculated MVA 1.4 cm2. Moderate to severe MR is present. Aortic valve with sclerosis, no stenosis. No AI. Moderate TR. Estimated PA pressure 50 mHg.  No prior study for comparison. No intracardiac mass, thrombus or vegetation was noted.      FINDINGS:  LEFT VENTRICLE: Size was normal. Systolic function was normal. Ejection fraction was estimated in the range of 55 % to 65 %. There were no regional wall motion abnormalities. Wall thickness was normal.  VENTRICULAR SEPTUM: No ventricular shunt was identified.   RIGHT VENTRICLE: The size was normal. Systolic function was normal. Wall thickness was normal.  LEFT ATRIUM: The atrium was dilated.  ATRIAL SEPTUM: There was no left-to-right shunt and no right-to-left shunt.  RIGHT ATRIUM: Size was normal.  AORTIC VALVE: The valve was trileaflet. Leaflets exhibited normal thickness and normal cuspal separation. Doppler: Transaortic velocity was within the normal range. There was no stenosis. There was no regurgitation.  MITRAL VALVE: Valve structure was normal. There was normal leaflet separation. There was moderately restricted mobility. No echocardiographic evidence for prolapse. Doppler: Transmitral velocity was increased due to valvular stenosis. The findings were consistent with moderate mitral stenosis. There was moderate to severe regurgitation.  TRICUSPID VALVE: The valve structure was normal. There was normal leaflet separation. Doppler: The transtricuspid velocity was within the normal range. There was no evidence for tricuspid stenosis. There was moderate regurgitation.  PULMONIC VALVE: Leaflets exhibited normal thickness, no calcification, and normal cuspal separation. Doppler: The transpulmonic velocity was within the normal range. There was trivial regurgitation.  AORTA: The root exhibited normal size.  PULMONARY ARTERY: The size was normal. Doppler: Systolic pressure was within the normal range.  SYSTEMIC VEINS: IVC: The inferior vena cava was normal in size.  PERICARDIUM: There was no pericardial effusion.      Chest 2 Views    02/11/2013   No active disease is seen in the chest.  Alicia Slimmer, MD  02/11/2013 2:29 PM     Nm Myocardial Perfusion Spect (stress And Rest)    02/12/2013   No  evidence of exercise-induced ischemia. The ejection fraction is 58 %  Alicia Nay, MD  02/12/2013 2:50 PM     US Carotid Duplex Dopp Comp Bilateral    02/12/2013  Impression: 1. Minimal plaque right and left carotid bifurcations with no significant stenosis of the internal  carotid arteries bilaterally. 2. Moderate homogeneous plaque mid to distal left common carotid artery with a stenosis in the range of 50%. 3. Bilateral antegrade vertebral artery flow.  Georgiana Spinner, MD  02/12/2013 12:28 PM         Physical Exam:   BP 146/84  Pulse 77  Temp 96.6 F (35.9 C) (Oral)  Resp 17  Ht 1.6 m (5\' 3" )  Wt 81.194 kg (179 lb)  BMI 31.72 kg/m2  SpO2 96%    General appearance - alert, and in no distress  HEENT: Normocephalic,atraumatic, pupil equal  Neck - supple  Chest - clear to auscultation  Heart - normal rate and regular rhythm  Abdomen - bowel sounds normal, soft, non distended  Extremities - no pedal edema  Neurological - Alert      Discharge condition:   Stable     Discharge  Diet :   Cardiac     Discharge Instructions:   Follow up:     Carney Living., MD  [address] [facility]  [place] [address]  [phone]    Schedule an appointment as soon as possible for a visit in 1 week  PCP    Montey Hora, MD  [address]  [address]  [place] [address]  [phone]    Schedule an appointment as soon as possible for a visit in 2 weeks  Cardiology        Christophe Louis, DO  02/12/2013  4:29 PM  Time spent  34 minutes

## 2013-02-12 NOTE — Plan of Care (Addendum)
ISHAPED done at bedside.  Pt denies pain and bathroom needs.  Pt repositions self.  Plan of care discussed with pt: stress test, echo, carotid doppler, meds, pain management, fall precautions.  Pt verbalized understanding of today's treatment plan.  White board updated.  Will continue to monitor and update to any changes.        Problem: Chest Pain  Goal: Pain at/below Pt Goal: Patient comfortable  Outcome: Completed Date Met:  02/12/13  Pt admitted with chest pain.  Pt denies pain at this time.  Pt drowsy and able to sleep well.  Pt has morphine and percocet available PRN for pain.  Will continue to monitor pain level and treat accordingly.

## 2013-03-05 ENCOUNTER — Emergency Department
Admission: EM | Admit: 2013-03-05 | Discharge: 2013-03-05 | Disposition: A | Discharge status: Home / Self Care | Payer: Enrolled Prime—HMO | Attending: Emergency Medicine | Admitting: Emergency Medicine

## 2013-03-05 ENCOUNTER — Emergency Department: Payer: Enrolled Prime—HMO

## 2013-03-05 DIAGNOSIS — Z7982 Long term (current) use of aspirin: Secondary | ICD-10-CM | POA: Insufficient documentation

## 2013-03-05 DIAGNOSIS — Z79899 Other long term (current) drug therapy: Secondary | ICD-10-CM | POA: Insufficient documentation

## 2013-03-05 DIAGNOSIS — J329 Chronic sinusitis, unspecified: Secondary | ICD-10-CM | POA: Insufficient documentation

## 2013-03-05 DIAGNOSIS — I10 Essential (primary) hypertension: Secondary | ICD-10-CM | POA: Insufficient documentation

## 2013-03-05 DIAGNOSIS — F17200 Nicotine dependence, unspecified, uncomplicated: Secondary | ICD-10-CM | POA: Insufficient documentation

## 2013-03-05 MED ORDER — AMOXICILLIN-POT CLAVULANATE 875-125 MG PO TABS
1.0000 | ORAL_TABLET | Freq: Two times a day (BID) | ORAL | Status: AC
Start: 2013-03-05 — End: 2013-03-15

## 2013-03-05 MED ORDER — AMOXICILLIN-POT CLAVULANATE 875-125 MG PO TABS
1.0000 | ORAL_TABLET | Freq: Once | ORAL | Status: AC
Start: 2013-03-05 — End: 2013-03-05
  Administered 2013-03-05: 1 via ORAL
  Filled 2013-03-05: qty 1

## 2013-03-05 NOTE — ED Provider Notes (Signed)
EMERGENCY DEPARTMENT HISTORY AND PHYSICAL EXAM     Physician/Midlevel provider first contact with patient: 03/05/13 2148         Date: 03/05/2013  Patient Name: Alicia Waters  Attending Physician:  Ames Dura, DO, FACOEP      History of Presenting Illness     Chief Complaint   Patient presents with   . Sinusitis       History Provided By: Pt   Chief Complaint: Nasal congestion   Onset: x yesterday   Timing: Worsening   Quality: Sinus pressure   Modifying Factors: Sx consistent with hx of sinusitis   Associated sxs: +Fever, sore throat, frontal pressure HA    Additional History: Alicia Waters is a 52 y.o. female here c/o sinus pain and nasal congestion x yesterday. Pt reports fever, sore throat, and a fontal pressure HA. Pt took Theraflu with no relief of sx. Pt states she usually experiences sinus infections once a year. She denies a particular abx that works best. Pt denies any other complaints at this time. Pt denies known drug allergies. She reports a hx of HTN controlled with medication. Pt wears a heart monitor patch at baseline.     PCP: Carney Living., MD      Current Facility-Administered Medications   Medication Dose Route Frequency Provider Last Rate Last Dose   . [COMPLETED] amoxicillin-clavulanate (AUGMENTIN) 875-125 MG per tablet 1 tablet  1 tablet Oral Once Ames Dura, DO   1 tablet at 03/05/13 2238     Current Outpatient Prescriptions   Medication Sig Dispense Refill   . aspirin EC 81 MG EC tablet Take 1 tablet (81 mg total) by mouth daily.    0   . Black Cohosh 540 MG Cap Take by mouth 2 (two) times daily.       . fexofenadine (ALLEGRA) 30 MG tablet Take 30 mg by mouth daily.       Marland Kitchen losartan-hydrochlorothiazide (HYZAAR) 100-25 MG per tablet Take 1 tablet by mouth daily.       . Multiple Vitamin (MULTIVITAMIN) capsule Take 1 capsule by mouth daily.       . rosuvastatin (CRESTOR) 5 MG tablet Take 5 mg by mouth daily.       . Tapentadol HCl (NUCYNTA ER) 150 MG Tablet SR 12  hr Take by mouth 2 (two) times daily.       Marland Kitchen amoxicillin-clavulanate (AUGMENTIN) 875-125 MG per tablet Take 1 tablet by mouth 2 (two) times daily.  20 tablet  0   . atorvastatin (LIPITOR) 40 MG tablet Take 1 tablet (40 mg total) by mouth nightly.  30 tablet  0       Past Medical History   Past Medical History:  Past Medical History   Diagnosis Date   . Hypertensive disorder    . Thyroid nodule    . Depression        Past Surgical History:  Past Surgical History   Procedure Date   . Sinus surgery    . Throat biiopsy    . Cesarean section    . Cervical fusion 01/2012       Family History:  No family history on file.    Social History:  History   Substance Use Topics   . Smoking status: Current Every Day Smoker -- 0.5 packs/day for 20 years     Types: Cigarettes   . Smokeless tobacco: Never Used   . Alcohol Use: No  Allergies:  No Known Allergies    Review of Systems     Review of Systems   Constitutional: Positive for fever.   HENT: Positive for congestion (nasal ) and sore throat.    Neurological: Positive for headaches (frontal, pressure ).   Endo/Heme/Allergies:        NKDA   All other systems reviewed and are negative.          Physical Exam     BP 162/83  Pulse 85  Temp 95.9 F (35.5 C)  Resp 18  Wt 78.926 kg  SpO2 99%  Pulse Oximetry Analysis - Normal 99% on RA    Physical Exam   Nursing note and vitals reviewed.  Constitutional: She is oriented to person, place, and time. She appears well-developed and well-nourished. No distress.   HENT:   Head: Normocephalic and atraumatic.   Mouth/Throat: Oropharynx is clear and moist. No oropharyngeal exudate.        B/L maxillary sinus tenderness. B/l nasal congestion.   Eyes: Conjunctivae normal are normal. No scleral icterus.   Neck: Normal range of motion. Neck supple.   Cardiovascular: Normal rate and regular rhythm.    Pulmonary/Chest: Effort normal. No respiratory distress.   Musculoskeletal: Normal range of motion. She exhibits no tenderness.    Lymphadenopathy:     She has no cervical adenopathy.   Neurological: She is alert and oriented to person, place, and time. She exhibits normal muscle tone.   Skin: Skin is warm and dry. She is not diaphoretic.   Psychiatric: She has a normal mood and affect. Her behavior is normal.         Diagnostic Study Results     Labs -     Results     ** No Results found for the last 24 hours. **          Radiologic Studies -   Radiology Results (24 Hour)     ** No Results found for the last 24 hours. **      .    Medical Decision Making   I am the first provider for this patient.    I reviewed the vital signs, available nursing notes, past medical history, past surgical history, family history and social history.    Vital Signs-Reviewed the patient's vital signs.     Patient Vitals for the past 12 hrs:   BP Temp Pulse Resp   03/05/13 2126 162/83 mmHg 95.9 F (35.5 C) 85  18        Old Medical Records: Nursing notes.       Doctor's Notes     ED Course:     10:52 PM  Discussed test results with pt and counseled on diagnosis, f/u plans, and signs and symptoms when to return to ED.  Pt is stable and ready for discharge.            Diagnosis and Treatment Plan       Clinical Impression:   1. Sinusitis        Treatment Plan:   ED Disposition     Discharge Alicia Waters discharge to home/self care.    Condition at disposition: Stable              _______________________________    Attestations:  This note is prepared by Tobie Poet, acting as scribe for Ames Dura, DO, [place]. The scribe's documentation has been prepared under my direction and personally reviewed by me in its entirety.  I confirm that the note above accurately reflects all work, treatment, procedures, and medical decision making performed by me.     I am the first provider for this patient.      Ames Dura, DO, FACOEP is the primary emergency doctor of record.    _______________________________            Ames Dura, DO  03/05/13  2329

## 2013-03-05 NOTE — Discharge Instructions (Signed)
Sinusitis    You have been seen for a sinus infection.    Sinus infections are common. They often happen after people have a virus or a common cold.    Symptoms are: Pain in the face, green or yellow mucous from the nose, fevers and chills. There may also be a feeling of fullness or pressure in the face.    Decongestants may help. This helps the sinuses drain. Sometimes a steroid spray is used.    You may need antibiotics for the infection. Not all cases of sinusitis need antibiotics. Viruses cause most sinusitis. Antibiotics do not work on viruses. Sometimes sinusitis and be caused by bacteria. These cases usually get better with medicine to help the sinuses drain. Antibiotics should be given only to patients who have symptoms for more than 2 weeks and if they have fevers, severe sinus pain and pus mixed in with the mucus.    YOU SHOULD SEEK MEDICAL ATTENTION IMMEDIATELY, EITHER HERE OR AT THE NEAREST EMERGENCY DEPARTMENT, IF ANY OF THE FOLLOWING OCCURS:   You do not get better with treatment.   You have severe headaches and high fevers or any confusion.   You have worse pain in the face. Your face gets more swollen.

## 2013-03-05 NOTE — ED Notes (Signed)
Alicia Waters is a 52 y.o. female c/o "sinus pain and congestion". Pt reports hx of sinusitis. Pt reports "no relief with Theraflu & sinusitis got worse & and worse so I decided to come here". No sob, n/v/d/c or any other complaints at this time. BP 162/83  Pulse 85  Temp 95.9 F (35.5 C)  Resp 18  Wt 78.926 kg  SpO2 99%

## 2013-04-24 ENCOUNTER — Ambulatory Visit
Admission: RE | Admit: 2013-04-24 | Discharge: 2013-04-24 | Disposition: A | Discharge status: Home / Self Care | Payer: Enrolled Prime—HMO | Source: Ambulatory Visit | Attending: Physician Assistant | Admitting: Physician Assistant

## 2013-04-24 ENCOUNTER — Other Ambulatory Visit: Payer: Self-pay | Admitting: Physician Assistant

## 2013-04-24 DIAGNOSIS — M545 Low back pain, unspecified: Secondary | ICD-10-CM

## 2013-05-06 ENCOUNTER — Emergency Department
Admission: EM | Admit: 2013-05-06 | Discharge: 2013-05-06 | Disposition: A | Discharge status: Home / Self Care | Payer: Enrolled Prime—HMO | Attending: Internal Medicine | Admitting: Internal Medicine

## 2013-05-06 ENCOUNTER — Inpatient Hospital Stay: Payer: Enrolled Prime—HMO

## 2013-05-06 ENCOUNTER — Emergency Department: Payer: Enrolled Prime—HMO

## 2013-05-06 DIAGNOSIS — F17200 Nicotine dependence, unspecified, uncomplicated: Secondary | ICD-10-CM | POA: Insufficient documentation

## 2013-05-06 DIAGNOSIS — I059 Rheumatic mitral valve disease, unspecified: Secondary | ICD-10-CM | POA: Insufficient documentation

## 2013-05-06 DIAGNOSIS — I10 Essential (primary) hypertension: Secondary | ICD-10-CM | POA: Insufficient documentation

## 2013-05-06 DIAGNOSIS — E041 Nontoxic single thyroid nodule: Secondary | ICD-10-CM | POA: Insufficient documentation

## 2013-05-06 DIAGNOSIS — R079 Chest pain, unspecified: Secondary | ICD-10-CM | POA: Insufficient documentation

## 2013-05-06 HISTORY — DX: Rheumatic fever without heart involvement: I00

## 2013-05-06 HISTORY — DX: Rheumatic mitral valve disease, unspecified: I05.9

## 2013-05-06 LAB — COMPREHENSIVE METABOLIC PANEL
ALT: 20 U/L (ref 0–55)
AST (SGOT): 18 U/L (ref 5–34)
Albumin/Globulin Ratio: 1 (ref 0.9–2.2)
Albumin: 3.3 g/dL — ABNORMAL LOW (ref 3.5–5.0)
Alkaline Phosphatase: 68 U/L (ref 40–150)
Anion Gap: 10 (ref 5.0–15.0)
BUN: 15 mg/dL (ref 7.0–19.0)
Bilirubin, Total: 0.3 mg/dL (ref 0.2–1.2)
CO2: 27 mEq/L (ref 22–29)
Calcium: 9.1 mg/dL (ref 8.5–10.5)
Chloride: 104 mEq/L (ref 98–107)
Creatinine: 0.9 mg/dL (ref 0.6–1.0)
Globulin: 3.3 g/dL (ref 2.0–3.6)
Glucose: 144 mg/dL — ABNORMAL HIGH (ref 70–100)
Potassium: 3.3 mEq/L — ABNORMAL LOW (ref 3.5–5.1)
Protein, Total: 6.6 g/dL (ref 6.0–8.3)
Sodium: 141 mEq/L (ref 136–145)

## 2013-05-06 LAB — ECG 12-LEAD
Atrial Rate: 81 {beats}/min
P Axis: 69 degrees
P-R Interval: 158 ms
Q-T Interval: 426 ms
QRS Duration: 82 ms
QTC Calculation (Bezet): 494 ms
R Axis: 55 degrees
T Axis: 53 degrees
Ventricular Rate: 81 {beats}/min

## 2013-05-06 LAB — CBC AND DIFFERENTIAL
Basophils Absolute Automated: 0.01 10*3/uL (ref 0.00–0.20)
Basophils Automated: 0 %
Eosinophils Absolute Automated: 0.09 10*3/uL (ref 0.00–0.70)
Eosinophils Automated: 1 %
Hematocrit: 40 % (ref 37.0–47.0)
Hgb: 13.5 g/dL (ref 12.0–16.0)
Immature Granulocytes Absolute: 0.01 10*3/uL
Immature Granulocytes: 0 %
Lymphocytes Absolute Automated: 2.67 10*3/uL (ref 0.50–4.40)
Lymphocytes Automated: 32 %
MCH: 32.4 pg — ABNORMAL HIGH (ref 28.0–32.0)
MCHC: 33.8 g/dL (ref 32.0–36.0)
MCV: 95.9 fL (ref 80.0–100.0)
MPV: 9.6 fL (ref 9.4–12.3)
Monocytes Absolute Automated: 0.78 10*3/uL (ref 0.00–1.20)
Monocytes: 9 %
Neutrophils Absolute: 4.85 10*3/uL (ref 1.80–8.10)
Neutrophils: 58 %
Nucleated RBC: 0 (ref 0–1)
Platelets: 244 10*3/uL (ref 140–400)
RBC: 4.17 10*6/uL — ABNORMAL LOW (ref 4.20–5.40)
RDW: 14 % (ref 12–15)
WBC: 8.4 10*3/uL (ref 3.50–10.80)

## 2013-05-06 LAB — GFR: EGFR: >60

## 2013-05-06 LAB — POTASSIUM: Potassium: 3.4 mEq/L — ABNORMAL LOW (ref 3.5–5.1)

## 2013-05-06 LAB — HEMOLYSIS INDEX
Hemolysis Index: 9 (ref 0–18)
Hemolysis Index: 3 (ref 0–18)
Hemolysis Index: 6 (ref 0–18)

## 2013-05-06 LAB — MAGNESIUM: Magnesium: 1.9 mg/dL (ref 1.6–2.6)

## 2013-05-06 LAB — TROPONIN I
Troponin I: <0.01 ng/mL
Troponin I: 0.01 ng/mL
Troponin I: <0.01 ng/mL

## 2013-05-06 MED ORDER — ROSUVASTATIN CALCIUM 10 MG PO TABS
5.0000 mg | ORAL_TABLET | Freq: Every day | ORAL | Status: DC
Start: 2013-05-06 — End: 2013-05-06
  Administered 2013-05-06: 5 mg via ORAL
  Filled 2013-05-06: qty 1

## 2013-05-06 MED ORDER — NALOXONE HCL 0.4 MG/ML IJ SOLN
0.2000 mg | INTRAMUSCULAR | Status: DC | PRN
Start: 2013-05-06 — End: 2013-05-06

## 2013-05-06 MED ORDER — IOHEXOL 350 MG/ML IV SOLN
100.0000 mL | Freq: Once | INTRAVENOUS | Status: AC | PRN
Start: 2013-05-06 — End: 2013-05-06
  Administered 2013-05-06: 100 mL via INTRAVENOUS

## 2013-05-06 MED ORDER — NITROGLYCERIN 0.4 MG SL SUBL
0.4000 mg | SUBLINGUAL_TABLET | SUBLINGUAL | Status: DC | PRN
Start: 2013-05-06 — End: 2013-05-06

## 2013-05-06 MED ORDER — ASPIRIN 81 MG PO TBEC
81.0000 mg | DELAYED_RELEASE_TABLET | Freq: Every day | ORAL | Status: DC
Start: 2013-05-06 — End: 2013-05-06
  Administered 2013-05-06: 81 mg via ORAL
  Filled 2013-05-06: qty 1

## 2013-05-06 MED ORDER — NON FORMULARY
1.0000 | Freq: Two times a day (BID) | Status: DC
Start: 2013-05-06 — End: 2013-05-06

## 2013-05-06 MED ORDER — ENOXAPARIN SODIUM 40 MG/0.4ML SC SOLN
40.0000 mg | Freq: Every day | SUBCUTANEOUS | Status: DC
Start: 2013-05-06 — End: 2013-05-06
  Administered 2013-05-06: 40 mg via SUBCUTANEOUS
  Filled 2013-05-06: qty 0.4

## 2013-05-06 MED ORDER — TRAZODONE HCL 50 MG PO TABS
50.0000 mg | ORAL_TABLET | Freq: Every evening | ORAL | Status: DC
Start: 2013-05-06 — End: 2013-05-06
  Filled 2013-05-06: qty 1

## 2013-05-06 MED ORDER — NITROGLYCERIN 0.4 MG SL SUBL
0.4000 mg | SUBLINGUAL_TABLET | SUBLINGUAL | Status: AC
Start: 2013-05-06 — End: 2013-05-06
  Administered 2013-05-06 (×3): 0.4 mg via SUBLINGUAL
  Filled 2013-05-06: qty 1

## 2013-05-06 MED ORDER — FENTANYL CITRATE 0.05 MG/ML IJ SOLN
25.0000 ug | INTRAMUSCULAR | Status: DC | PRN
Start: 2013-05-06 — End: 2013-05-06

## 2013-05-06 MED ORDER — CETIRIZINE HCL 10 MG PO TABS
10.0000 mg | ORAL_TABLET | Freq: Every day | ORAL | Status: DC
Start: 2013-05-06 — End: 2013-05-06
  Administered 2013-05-06: 10 mg via ORAL
  Filled 2013-05-06: qty 1

## 2013-05-06 MED ORDER — DULOXETINE HCL 30 MG PO CPEP
30.0000 mg | ORAL_CAPSULE | Freq: Every day | ORAL | Status: DC
Start: 2013-05-06 — End: 2013-05-06
  Filled 2013-05-06: qty 1

## 2013-05-06 MED ORDER — HYDROCHLOROTHIAZIDE 25 MG PO TABS
25.0000 mg | ORAL_TABLET | Freq: Every day | ORAL | Status: DC
Start: 2013-05-06 — End: 2013-05-06
  Filled 2013-05-06 (×2): qty 1

## 2013-05-06 MED ORDER — ONDANSETRON HCL 4 MG/2ML IJ SOLN
4.0000 mg | Freq: Three times a day (TID) | INTRAMUSCULAR | Status: DC | PRN
Start: 2013-05-06 — End: 2013-05-06

## 2013-05-06 MED ORDER — NITROGLYCERIN 2 % TD OINT
0.5000 [in_us] | TOPICAL_OINTMENT | TRANSDERMAL | Status: AC
Start: 2013-05-06 — End: 2013-05-06
  Filled 2013-05-06: qty 1

## 2013-05-06 MED ORDER — POTASSIUM CHLORIDE CRYS ER 20 MEQ PO TBCR
40.0000 meq | EXTENDED_RELEASE_TABLET | Freq: Once | ORAL | Status: AC
Start: 2013-05-06 — End: 2013-05-06
  Administered 2013-05-06: 40 meq via ORAL
  Filled 2013-05-06: qty 2

## 2013-05-06 MED ORDER — ASPIRIN 81 MG PO CHEW
324.0000 mg | CHEWABLE_TABLET | Freq: Once | ORAL | Status: AC
Start: 2013-05-06 — End: 2013-05-06
  Administered 2013-05-06: 324 mg via ORAL
  Filled 2013-05-06: qty 4

## 2013-05-06 MED ORDER — MORPHINE SULFATE 2 MG/ML IJ/IV SOLN (WRAP)
2.0000 mg | Freq: Once | Status: DC
Start: 2013-05-06 — End: 2013-05-06

## 2013-05-06 MED ORDER — AMLODIPINE BESYLATE 5 MG PO TABS
5.0000 mg | ORAL_TABLET | Freq: Every day | ORAL | Status: DC
Start: 2013-05-06 — End: 2013-05-06
  Administered 2013-05-06: 5 mg via ORAL
  Filled 2013-05-06: qty 1

## 2013-05-06 MED ORDER — FENTANYL CITRATE 0.05 MG/ML IJ SOLN
25.0000 ug | Freq: Once | INTRAMUSCULAR | Status: AC
Start: 2013-05-06 — End: 2013-05-06
  Administered 2013-05-06: 25 ug via INTRAVENOUS
  Filled 2013-05-06: qty 2

## 2013-05-06 MED ORDER — LOSARTAN POTASSIUM 100 MG PO TABS
100.0000 mg | ORAL_TABLET | Freq: Every day | ORAL | Status: DC
Start: 2013-05-06 — End: 2013-05-06
  Administered 2013-05-06: 100 mg via ORAL
  Filled 2013-05-06 (×2): qty 1

## 2013-05-06 MED ORDER — NON FORMULARY
30.0000 mg | Freq: Every day | Status: DC
Start: 2013-05-06 — End: 2013-05-06

## 2013-05-06 MED ORDER — ACETAMINOPHEN 325 MG PO TABS
650.0000 mg | ORAL_TABLET | Freq: Four times a day (QID) | ORAL | Status: DC | PRN
Start: 2013-05-06 — End: 2013-05-06

## 2013-05-06 NOTE — ED Notes (Signed)
Called and spoke to Dr Jomarie Longs. Pt has a potassium level of 3.3; will give of potassium and tell pt to follow up with her primary care doctor in 2 days per Dr Jomarie Longs. Pt can be discharged.

## 2013-05-06 NOTE — Progress Notes (Signed)
PROGRESS NOTE    Date: 02/09/20151:28 PM  Patient Name:Alicia Waters,Alicia Waters 53 y.o. female admitted with <principal problem not specified>        Subjective:   Pt denies any cp,sob      Medications:   Scheduled Meds:  Current Facility-Administered Medications   Medication Dose Route Frequency   . amLODIPine  5 mg Oral Daily   . [COMPLETED] aspirin  324 mg Oral Once   . aspirin EC  81 mg Oral Daily   . cetirizine  10 mg Oral Daily   . DULoxetine  30 mg Oral Daily   . enoxaparin  40 mg Subcutaneous Daily   . [COMPLETED] fentaNYL  25 mcg Intravenous Once   . hydrochlorothiazide  25 mg Oral Daily   . losartan  100 mg Oral Daily   . [EXPIRED] nitroglycerin  0.5 inch Topical Q6H HOLD MN   . [COMPLETED] nitroglycerin  0.4 mg Sublingual Q5 Min   . NON-FORMULARY order form  1 puff Other BID   . [COMPLETED] potassium chloride  40 mEq Oral Once   . potassium chloride  40 mEq Oral Once   . rosuvastatin  5 mg Oral Daily   . traZODone  50 mg Oral QHS   . [DISCONTINUED] morphine  2 mg Intravenous Once   . [DISCONTINUED] NON-FORMULARY order form  30 mg Oral Daily     Continuous Infusions:   PRN Meds:acetaminophen, fentaNYL, [COMPLETED] iohexol, naloxone, nitroglycerin, ondansetron      Review of Systems:   General: Negative for Fever  Respiratory :Negative for cough, shortness of breath, or wheezing  Cardiovascular:Negative for chest pain,palpitation or dyspnea   Gastrointestinal:Negative for abdominal pain,Nausea bloody stools  Neurological: Negative for - confusion, dizziness, or focal weakness        Physical Exam:     Filed Vitals:    05/06/13 1047   BP: 118/65   Pulse: 72   Temp:    Resp: 18   SpO2: 98%       General appearance - alert, well appearing,  Chest - clear to auscultation, no wheezes, rales or rhonchi  Heart - normal rate and regular rhythm  Abdomen - bowel sounds normal, soft, non distended,no masses or organomegaly  Extremities -no pedal edema.  Neurological - Alert, no focal findings     Labs:       Lab 05/06/13  0222   WBC 8.40   HGB 13.5   HCT 40.0   PLT 244       Lab 05/06/13 0222   NA 141   K 3.3*   CL 104   CO2 27   BUN 15.0   CREAT 0.9   EGFR >60.0   GLU 144*   CA 9.1   ALB 3.3*   PHOS --       Lab 05/06/13 1008   MG 1.9     No components found with this basename: CXBLD  No components found with this basename: CXURN        Rads:     Radiology Results (24 Hour)     Procedure Component Value Units Date/Time    CT Angiogram Chest [[id number]] Collected:05/06/13 1314    Order Status:Completed  Updated:05/06/13 1319    Narrative:    HISTORY: Chest pain and shortness of breath    TECHNIQUE: CT angiography of chest performed using pulmonary embolism  protocol with 100 cc of Omnipaque 350. 3-D post-processing was obtained  on the scanner.    COMPARISON: None available  FINDINGS: No CT evidence of acute pulmonary embolism. Heart size normal.  No pericardial effusions. No CT evidence of significant mediastinal  adenopathy. The lung parenchyma contains no pleural effusions or frank  consolidation. Biapical pleural-parenchymal scarring is present in the  posterior apices. Osseous structures are intact. Anterior cervical plate  and screw fusion devices are noted at the cervical thoracic spinal  junction. Visualized upper abdomen unremarkable.      Impression:      No CT evidence of acute pulmonary embolism.    Ivin Poot, MD   05/06/2013 1:15 PM    Chest AP Portable [[id number]] Collected:05/06/13 0732    Order Status:Completed  Updated:05/06/13 5366    Narrative:    HISTORY: Left sided chest pain    EXAMINATION: Portable frontal view of chest at 0235    COMPARISON: 02/11/2013     FINDINGS: There has been no significant interval change. No focal  consolidation or effusions. No acute edema. Cardiac silhouette not  enlarged. Pleural surfaces and osseous structures are intact.  Redemonstration of cervical plate and screw fixation devices in the  visualized lower cervical spine.      Impression:     No radiographic evidence of acute  cardiopulmonary process.    Ivin Poot, MD   05/06/2013 7:33 AM            Assessment:     Chest Pain, atypical    Self reported LHC January 2015 - No CAD reported.    Tobacco use   HTN   Hypokalemia  CT angio Neg for PE.        Plan:     Can  pt home.  F/u with cardiology as op      Loetta Rough, MD    05/06/2013 1:28 PM

## 2013-05-06 NOTE — H&P (Signed)
Patient Type: I     ATTENDING PHYSICIAN: Rolan Lipa, MD     PRESENTING COMPLAINT:  Left-sided chest pain for 1 day.     HISTORY OF PRESENT ILLNESS:  The patient is a 53 year old African American female with past medical  history significant for hypertension, started getting episodes of the chest  pain yesterday.  At 9:00 the pain was severe as she described 8/10 and she  reported to the emergency department of [facility].  Pain  has been recurrent, localized to the left front of the chest, associated  with shortness of breath and palpitation and diaphoresis.  History of  dyspnea on exertion of less than a block, which is relieved by rest.   Patient also gives the history of the chest tightness when she goes up a  flight of stairs upwards.  No history of cough, fever, rigors, chills,  hematemesis, hemoptysis, or melena.  No history of nausea, vomiting,  diarrhea.  No history of dysuria, pyuria, or hematuria.  No history of  trauma to the chest.     PAST MEDICAL HISTORY:  Is significant for hypertension, thyroid nodule, depression, and mitral  valve disorder.     PAST SURGICAL HISTORY:  Is significant for sinus surgery, throat biopsy, cesarean section, and  cervical vertebral fusion.     FAMILY HISTORY:  Is significant for breast carcinoma, CVA, hypertension, and diabetes  mellitus.     SOCIAL HISTORY:  History of smoking half pack per day for last 20 years.  Drinks alcohol  socially.  Denies any illicit use of drugs.     CURRENT MEDICATIONS:  Hyzaar (100/25) 1 tablet daily, Norvasc 5 mg daily.  Ipratropium nasal  spray inhalation 1 puff to each nostril b.i.d., duloxetine 30 mg daily,  aspirin 81 mg daily, trazodone 50 mg p.o. nightly, Nucynta extended release  150 mg daily, Crestor 5 mg p.o. daily.       ALLERGIES:  None.     REVIEW OF SYSTEMS:  GENERAL:  Is significant for healthy look.  RESPIRATORY:  Is significant for shortness of breath.  CARDIOVASCULAR:  Is significant for chest  pain.  MUSCULOSKELETAL:  Is significant for aches and pains in the body joints.    The rest of the systems reviewed were found unremarkable.     FAMILY HISTORY:  Significant for hypertension and diabetes.     PHYSICAL EXAMINATION:  VITAL SIGNS:  At the time of my evaluation, blood pressure 129/66, heart  rate 68, temperature 96.9, respiratory rate 18, oxygen saturation 98% on  room air.  GENERAL:  Healthy looking female lying in the bed comfortably.  HEAD:  Atraumatic, normocephalic.  NECK:  Supple, no thyromegaly, no lymphadenopathy.  CHEST:  Vesicular breathing, no added sound.  HEART:  S1, S2, regular in rate and rhythm, no gallop, no murmur.  ABDOMEN:  Obese, soft, nontender.  No organomegaly, normally active bowel  sounds.  CENTRAL NERVOUS SYSTEM:  Alert and oriented x3, no neurological deficit.  EXTREMITIES:  Pedal edema absent, peripheral pulses normally present.     LABORATORY DATA:  First set of troponin less than 0.01.  Comprehensive metabolic panel:   Glucose 144, BUN 15, creatinine 9.  Sodium 141, potassium 3.3, AST 18, ALT  20, alkaline phosphatase 68, total bilirubin 0.03.  Hematology:  CBC:  WBC  is 8.4, H and H of 13.5 and 40.  Platelets 244.     IMAGING STUDIES:  Chest x-ray:  No cardiopulmonary disease is identified.  EKG  81 beats per  minute, normal sinus rhythm, no ST-T changes.     IMPRESSION:  1.  Chest pain, rule out acute coronary syndrome.  Hypertension,  depression, insomnia, nicotine addiction.     HOSPITAL COURSE:  The patient was admitted to the telemetry unit.  Activity:  Bed rest.   Diet:  N.p.o. for now.  Vitals as per unit protocol.  Oxygen 2 liters per  minute via nasal cannula to be titrated to more than oxygen concentration  92%.  Peptic ulcer disease prophylaxis with famotidine 20 mg IV b.i.d.   Deep venous thrombosis prophylaxis with sequential compression devices and  heparin 40 mg subcutaneous daily.  Nitroglycerin 0.4 mg sublingual every 5  minutes x3 for the chest pain,  nitroglycerin ointment 2% 0.5-inch to  anterior chest wall to be held from 12:00 midnight to 6:00 in the morning,  aspirin 81 mg p.o. now and daily, amlodipine 5 mg p.o. a.m. daily, Crestor  5 mg p.o. a.m. daily, rosuvastatin 5 mg p.o. daily, trazodone 50 mg.   Repeat laboratory data, CBC, BMP with magnesium, PT, INR with the next  morning lab.  Liver panel with the next morning lab.  Troponins q.6 h. x 3.   Tylenol 650 mg p.o. q.6 h. p.r.n. for headache.  Cardiology consultation  with [facility], ED physician left a message on their voicemail.  The  patient is currently made n.p.o. for a possible intervention like a nuclear  stress test.  However, if decided otherwise, she will be taken off n.p.o.  order.     Thank you very much for allowing me to participate in the care of this  patient.     Time spent in patient care 70 minutes.           D:  05/06/2013 05:38 AM by Dr. Jerolyn Center A. Karie Mainland, MD ([id number])  T:  05/06/2013 07:39 AM by       Everlean Cherry: [id number]) (Doc ID: [id number])

## 2013-05-06 NOTE — ED Notes (Signed)
Pt from home with family c/o left CP intermittently for a week with increase chest pain today and SOB. Hx CP, future  stent surgery needed.

## 2013-05-06 NOTE — ED Notes (Signed)
Bed:PU37<BR> Expected date:<BR> Expected time:<BR> Means of arrival:<BR> Comments:<BR>

## 2013-05-06 NOTE — Consults (Signed)
[facility] CONSULTATION REPORT  [facility]    Date Time: 05/06/2013 8:57 AM  Patient Name: Alicia Waters, Alicia Waters  Date of Birth: June 01, 1960  CSN: [id number]  MRN: [id number]  Requesting Physician: Loetta Rough, MD    Reason for Consultation:   Chest Pain      History:   Alicia Waters is a 53 y.o. female who presents to the hospital on 05/06/2013.  She reports intermittent episodes of left sided chest pain that was intermittently present yesterday. Last night, pain became more continuous and she presented to ER. She locates an area under left breast. Last night episode lasted 3 hours. Improved today. Associated SOB.  No other new systemic complaints.  Chronic LBP.    Followed by a cardiologist at [facility]. Reports a recent [facility] Jan 2015. She reports there was no CAD noted but valvular heart disease and was being referred to [facility] for apparent percutaneous valve procedure (?valvuloplasty)    Past Medical History:     Past Medical History   Diagnosis Date   . Hypertensive disorder    . Thyroid nodule    . Depression    . Mitral valve disorder    . Rheumatic fever      Cardiac Nuclear Study November 2014:  FINDINGS: Breast attenuation artifact noted which is confirmed with the   QA data. No reversible perfusion defect identified. The gated study   demonstrates no significant wall motion abnormality. The ejection   fraction is 58%.   IMPRESSION:   No evidence of exercise-induced ischemia. The ejection   fraction is 58 %    ECHO November 2014:  IMPRESSIONS:   Normal LV size and systolic function. Estimated LVEF 65%. No LVH. Mitral valve   leaflets appear "teathered" with reduced opening. Moderate leaflet thickening.   Mild mitral annular calcification without significant leaflet calcification.   Mitral valve subvalvular apparatus does not appear fused. Moderate mitral   stenosis is present with calculated MVA 1.4 cm2. Moderate to severe MR is   present. Aortic valve with sclerosis, no stenosis. No  AI. Moderate TR.   Estimated PA pressure 50 mHg.      Past Surgical History:     Past Surgical History   Procedure Date   . Sinus surgery    . Throat biiopsy    . Cesarean section    . Cervical fusion 01/2012       Family History:   History reviewed. No pertinent family history.    Social History:     History     Social History   . Marital Status: Married     Spouse Name: N/A     Number of Children: N/A   . Years of Education: N/A     Social History Main Topics   . Smoking status: Current Every Day Smoker -- 0.5 packs/day for 20 years     Types: Cigarettes   . Smokeless tobacco: Never Used   . Alcohol Use: No   . Drug Use: No   . Sexually Active:      Other Topics Concern   . Not on file     Social History Narrative   . No narrative on file       Allergies:   No Known Allergies    Hospital Medications:      Scheduled Meds: PRN Meds:         amLODIPine 5 mg Oral Daily   [COMPLETED] aspirin 324 mg Oral Once   aspirin EC  81 mg Oral Daily   cetirizine 10 mg Oral Daily   DULoxetine 30 mg Oral Daily   enoxaparin 40 mg Subcutaneous Daily   [COMPLETED] fentaNYL 25 mcg Intravenous Once   hydrochlorothiazide 25 mg Oral Daily   losartan 100 mg Oral Daily   nitroglycerin 0.5 inch Topical Q6H HOLD MN   [COMPLETED] nitroglycerin 0.4 mg Sublingual Q5 Min   NON-FORMULARY order form 1 puff Other BID   rosuvastatin 5 mg Oral Daily   traZODone 50 mg Oral QHS   [DISCONTINUED] morphine 2 mg Intravenous Once   [DISCONTINUED] NON-FORMULARY order form 30 mg Oral Daily       Continuous Infusions:         acetaminophen 650 mg Q6H PRN   naloxone 0.2 mg PRN   nitroglycerin 0.4 mg Q5 Min PRN           Home Medications:     Hyzaar (100/25) 1 tablet daily, Norvasc 5 mg daily. Ipratropium nasal   spray inhalation 1 puff to each nostril b.i.d., duloxetine 30 mg daily,   aspirin 81 mg daily, trazodone 50 mg p.o. nightly, Nucynta extended release   150 mg daily, Crestor 5 mg p.o. daily.     Review of Systems:    Comprehensive review of systems  including constitutional, eyes, ears, nose, mouth, throat, cardiovascular, GI, GU, musculoskeletal, integumentary, respiratory, neurologic, psychiatric, and endocrine is negative other than what is mentioned already in the history of present illness    Physical Exam:     VITAL SIGNS   Temp:  [96.6 F (35.9 C)] 96.6 F (35.9 C)  Heart Rate:  [68-85] 70   Resp Rate:  [16-18] 16   BP: (116-132)/(63-74) 132/69 mmHg  No Data Recorded  SpO2: 97 %  No intake or output data in the 24 hours ending 05/06/13 0857       GENERAL: Patient is in no acute distress   HEENT:  Sclera clear, moist mucous membranes   NECK: No jugular venous distention, normal carotid upstrokes without bruits   CARDIAC: Normal apical impulse, regular rate and rhythm, with normal S1 and S2, 1/6 systolic murmur  CHEST: Clear to auscultation bilaterally, normal respiratory effort  ABDOMEN: Nontender, non-distended  EXTREMITIES: Trace edema  NEUROLOGIC: Alert and oriented to time, place and person, normal mood and affect   MUSCULOSKELETAL: Normal muscle strength and tone for age.      Labs:     CBC w/Diff     Lab 05/06/13 0222   WBC 8.40   HGB 13.5   HCT 40.0   PLT 244   NEUTROPCT --   MONOPCT --          Comprehensive Metabolic Profile     Lab 05/06/13 0222   NA 141   K 3.3*   CL 104   CO2 27   BUN 15.0   CREAT 0.9   CA 9.1   ALB 3.3*   PROT 6.6   BILITOTAL 0.3   ALKPHOS 68   ALT 20   AST 18   GLU 144*   MG --          Cardiac Enzymes     Lab 05/06/13 0222   CK --   TROPI <0.01   TROPT --   CKMBINDEX --       No results found for this basename: BNP          Thyroid Studies    No results found for this basename: TSH,FREET3,FREET4 in the  last 168 hours        Lipid Profile   No results found for this basename: CHOL in the last 168 hours       Coagulation Studies   No results found for this basename: PT:3,INR:3,PTT:3 in the last 168 hours         EKG:   NSR Biatrial enlargement. No change from prior EKG      Telemetry:   NSR in ER    Rads:   Chest Ap  Portable    05/06/2013  HISTORY: Left sided chest pain  EXAMINATION: Portable frontal view of chest at 0235  COMPARISON: 02/11/2013   FINDINGS: There has been no significant interval change. No focal consolidation or effusions. No acute edema. Cardiac silhouette not enlarged. Pleural surfaces and osseous structures are intact. Redemonstration of cervical plate and screw fixation devices in the visualized lower cervical spine.      05/06/2013   No radiographic evidence of acute cardiopulmonary process.  Ivin Poot, MD  05/06/2013 7:33 AM     Xr Lumbar Spine Flexion And Extension Only    04/24/2013  INDICATION: Back pain.  TECHNIQUE: Neutral, flexion, and extension lateral films lumbosacral spine were obtained. 3 views.  FINDINGS: The vertebral body heights and intervertebral disc space heights are maintained. The vertebral bodies are well aligned. No subluxation is noted with flexion and extension. There are small anterior osteophytes at the L3-4 and L4-5 levels. There is vascular calcification.      04/24/2013   Mild degenerative changes. No subluxation is noted with flexion and extension.  Merri Ray, MD  04/24/2013 12:47 PM        Assessment:    Chest Pain, atypical with associated SOB.   Self reported LHC January 2015 - No CAD reported.   Valvular heart disease/Mitral disease - being referred to [facility] for percutaneous valve procedure (?valvuloplasty)   Tobacco use   HTN   Hypokalemia    Recommendations:    Tobacco cessation   Chest CT   Serial troponins   Replace K   Check magnesium   Records from [facility] to be requested.   If Chest CT and troponins are negative, OK to discharge from a cardiac standpoint with early followup with her cardiologist at [facility]. Otherwise, further cardiac recs will follow pending above.    Signed by: Sandria Senter, MD    [facility]  NP Spectralink [phone] (8am-5pm)  MD Spectralink [phone] or [phone]  Dr Letitia Neri [phone] (8am-5pm)  After hours, non urgent  consult line [phone]  After Hours, urgent consults [phone]

## 2013-05-06 NOTE — ED Provider Notes (Signed)
EMERGENCY DEPARTMENT HISTORY AND PHYSICAL EXAM     Physician/Midlevel provider first contact with patient: 05/06/13 0148         Date: 05/06/2013  Patient Name: Alicia Waters    History of Presenting Illness     Chief Complaint   Patient presents with   . Chest Pain     left    . Shortness of Breath       History Provided By: patient     Chief Complaint: chest pain   Onset: yesterday   Timing: intermittent  Location: left  Quality: squeezing  Associated Symptoms: SOB    Additional History: Alicia Waters is a 53 y.o. female, with a h/o hypertension presenting to the ED with intermittent left sided squeezing CP since yesterday that has worsened in frequency today. Patient notes symptoms are similar to prior episode in November 2014, however states pain is worse. Patient states the pain briefly radiated to the right side of the chest. Associated symptom includes SOB, cough, neck pain, extremity pain, exertional CP. No family history of AAA or aortic dissection. Patient smokes. No illicit drugs.    PCP: Carney Living., MD Lysle Dingwall)  Cardiologist: Marena Chancy       Current Facility-Administered Medications   Medication Dose Route Frequency Provider Last Rate Last Dose   . aspirin chewable tablet 324 mg  324 mg Oral Once Maryella Shivers, MD       . [COMPLETED] fentaNYL (SUBLIMAZE) injection 25 mcg  25 mcg Intravenous Once Maryella Shivers, MD   25 mcg at 05/06/13 0244   . [COMPLETED] nitroglycerin (NITROSTAT) SL tablet 0.4 mg  0.4 mg Sublingual Q5 Min Maryella Shivers, MD   0.4 mg at 05/06/13 0215   . [DISCONTINUED] morphine injection 2 mg  2 mg Intravenous Once Maryella Shivers, MD         Current Outpatient Prescriptions   Medication Sig Dispense Refill   . amLODIPine (NORVASC) 5 MG tablet Take 5 mg by mouth daily.       . DULoxetine (CYMBALTA) 30 MG capsule Take 30 mg by mouth daily.       . Flaxseed, Linseed, (FLAX SEEDS PO) Take by mouth.       Marland Kitchen ibuprofen (ADVIL,MOTRIN) 800 MG tablet Take 800 mg by mouth  every 6 (six) hours as needed.       Marland Kitchen ipratropium (ATROVENT) 0.03 % nasal spray 2 sprays by Nasal route every 12 (twelve) hours.       Marland Kitchen losartan-hydrochlorothiazide (HYZAAR) 100-25 MG per tablet Take 1 tablet by mouth daily.       . traZODone (DESYREL) 50 MG tablet Take 50 mg by mouth nightly.       Marland Kitchen aspirin EC 81 MG EC tablet Take 1 tablet (81 mg total) by mouth daily.    0   . atorvastatin (LIPITOR) 40 MG tablet Take 1 tablet (40 mg total) by mouth nightly.  30 tablet  0   . Black Cohosh 540 MG Cap Take by mouth 2 (two) times daily.       . fexofenadine (ALLEGRA) 30 MG tablet Take 30 mg by mouth daily.       . Multiple Vitamin (MULTIVITAMIN) capsule Take 1 capsule by mouth daily.       . rosuvastatin (CRESTOR) 5 MG tablet Take 5 mg by mouth daily.       . Tapentadol HCl (NUCYNTA ER) 150 MG Tablet SR 12 hr Take by mouth  2 (two) times daily.           Past History     Past Medical History:  Past Medical History   Diagnosis Date   . Hypertensive disorder    . Thyroid nodule    . Depression    . Mitral valve disorder        Past Surgical History:  Past Surgical History   Procedure Date   . Sinus surgery    . Throat biiopsy    . Cesarean section    . Cervical fusion 01/2012       Family History:  History reviewed. No pertinent family history.    Social History:  History   Substance Use Topics   . Smoking status: Current Every Day Smoker -- 0.5 packs/day for 20 years     Types: Cigarettes   . Smokeless tobacco: Never Used   . Alcohol Use: No       Allergies:  No Known Allergies    Review of Systems     Review of Systems   Constitutional: Negative for fever, chills and diaphoresis.   HENT: Negative for rhinorrhea and sore throat.    Eyes: Negative for pain and redness.   Respiratory: Positive for shortness of breath. Negative for cough.    Cardiovascular: Positive for chest pain. Negative for leg swelling.   Gastrointestinal: Negative for nausea, vomiting and abdominal pain.   Genitourinary: Negative for dysuria and  frequency.   Musculoskeletal: Negative for back pain, gait problem and neck pain.        - extremity pain   Skin: Negative for color change and rash.   Neurological: Negative for syncope, weakness and numbness.   Psychiatric/Behavioral: Negative for dysphoric mood.          Physical Exam   BP 129/66  Pulse 68  Temp 96.6 F (35.9 C)  Resp 18  SpO2 98%    Constitutional: Vital signs reviewed. Well appearing. No distress.  Head: Normocephalic, atraumatic  Eyes: Conjunctiva and sclera are normal.  No injection or discharge.  Ears, Nose, Throat:  Normal external examination of the nose and ears.  Mucous membranes moist.  Neck: Normal range of motion. Supple, no meningeal signs. Trachea midline.  Respiratory/Chest: Clear to auscultation. No respiratory distress.   Cardiovascular: Regular rate and rhythm. No murmurs.  Abdomen:  Bowel sounds intact. No rebound or guarding. Soft.  Non-tender.  Back: No cva tenderness to percussion.  Upper Extremity:  No edema. No cyanosis. Bilateral radial pulses intact and equal.   Lower Extremity:  No edema. No cyanosis. Bilateral calves symmetrical and non-tender.   Skin: Warm and dry. No rash.  Neuro: A&Ox3. Moves all extremities spontaneously. Normal gait.   Psychiatric: Normal affect.  Normal insight.      Diagnostic Study Results     Labs -     Results     Procedure Component Value Units Date/Time    Troponin I [[id number]] Collected:05/06/13 0222    Specimen Information:Blood Updated:05/06/13 0253     Troponin I <0.01 ng/mL     Comprehensive Metabolic Panel (CMP) [[id number]]  (Abnormal) Collected:05/06/13 0222    Specimen Information:Blood Updated:05/06/13 0245     Glucose 144 (H) mg/dL      BUN 32.9 mg/dL      Creatinine 0.9 mg/dL      Sodium 518 mEq/L      Potassium 3.3 (L) mEq/L      Chloride 104 mEq/L      CO2 27  mEq/L      CALCIUM 9.1 mg/dL      Protein, Total 6.6 g/dL      Albumin 3.3 (L) g/dL      AST (SGOT) 18 U/L      ALT 20 U/L      Alkaline Phosphatase 68 U/L       Bilirubin, Total 0.3 mg/dL      Globulin 3.3 g/dL      Albumin/Globulin Ratio 1.0      Anion Gap 10.0     Hemolysis index [[id number]] Collected:05/06/13 0222     Hemolysis Index 9 Updated:05/06/13 0245    GFR [[id number]] Collected:05/06/13 0222     EGFR >60.0 Updated:05/06/13 0245    CBC and differential [[id number]]  (Abnormal) Collected:05/06/13 0222    Specimen Information:Blood / Blood Updated:05/06/13 0232     WBC 8.40 x10 3/uL      RBC 4.17 (L) x10 6/uL      Hgb 13.5 g/dL      Hematocrit 54.2 %      MCV 95.9 fL      MCH 32.4 (H) pg      MCHC 33.8 g/dL      RDW 14 %      Platelets 244 x10 3/uL      MPV 9.6 fL      Neutrophils 58 %      Lymphocytes Automated 32 %      Monocytes 9 %      Eosinophils Automated 1 %      Basophils Automated 0 %      Immature Granulocyte 0 %      Nucleated RBC 0      Neutrophils Absolute 4.85 x10 3/uL      Abs Lymph Automated 2.67 x10 3/uL      Abs Mono Automated 0.78 x10 3/uL      Abs Eos Automated 0.09 x10 3/uL      Absolute Baso Automated 0.01 x10 3/uL      Absolute Immature Granulocyte 0.01 x10 3/uL           Radiologic Studies -   Radiology Results (24 Hour)     Procedure Component Value Units Date/Time    Chest AP Portable [[id number]]  (Normal) Resulted:05/06/13 0309    Order Status:Completed  Updated:05/06/13 3151      .      Medical Decision Making   I am the first provider for this patient.    I reviewed the vital signs, available nursing notes, past medical history, past surgical history, family history and social history.    Vital Signs-Reviewed the patient's vital signs.     Patient Vitals for the past 12 hrs:   BP Temp Pulse Resp   05/06/13 0311 129/66 mmHg - - -   05/06/13 0308 - - 68  -   05/06/13 0211 116/63 mmHg - 74  18    05/06/13 0141 126/74 mmHg 96.6 F (35.9 C) 85  18        Pulse Oximetry Analysis - Normal 100% on RA    Cardiac Monitor:  Rate: 80  Rhythm:  Normal Sinus Rhythm     EKG:  Interpreted by the EP.   Time Interpreted: 0150   Rate: 81   Rhythm: Normal  Sinus Rhythm    Interpretation: no ST elevation, QT prolongation    Comparison: February 11, 2013 decrease amplitude of wave T waves in lateral leads. New prolongation of QT waves     Old  Medical Records: Old medical records.  Previous electrocardiograms.  Previous radiology studies.     ED Course:     2:39 AM - there is some improvement with pain.     3:10 AM - Pt chest pain free.     3:30 AM - Discussed with Dr. Karie Mainland who accepts pt for Dr. Jomarie Longs.    3:40 AM - Message left for [facility].    Provider Notes: Pt with history of HTN, mitral valve disease, family history of premature CAD presenting with L sided chest pain. Low suspicion for aortic dissection based on equal bilateral radial pulses, no mediastinal widening on CXR, and patient's description of symptoms. No pulsatile abdominal mass or abdominal tenderness to suggest AAA. Wells score zero, PERC score one (for age), low suspicion for PE. No infectious complaints or risk factors to suggest pericarditis or endocarditis. No JVD or cardiac enlargement to suggest tamponade. Negative stress test Nov 2014. Given new chest pain over past few days, will admit for further management.       Core Measures:  Adult Chest Pain:  12-lead EKG was preformed in the ED. Aspirin was given.      Diagnosis     Clinical Impression:   1. Chest pain        _______________________________    Attestations:  This note is prepared by Suzy Bouchard, acting as Scribe for Lynnea Ferrier, MD.    Lynnea Ferrier, MD: The scribe's documentation has been prepared under my direction and personally reviewed by me in its entirety.  I confirm that the note above accurately reflects all work, treatment, procedures, and medical decision making performed by me.  _______________________________          Maryella Shivers, MD  05/06/13 [phone]

## 2014-01-09 ENCOUNTER — Emergency Department: Payer: Enrolled Prime—HMO

## 2014-01-09 ENCOUNTER — Emergency Department
Admission: EM | Admit: 2014-01-09 | Discharge: 2014-01-09 | Disposition: A | Discharge status: Home / Self Care | Payer: Enrolled Prime—HMO | Attending: Emergency Medicine | Admitting: Emergency Medicine

## 2014-01-09 DIAGNOSIS — J0111 Acute recurrent frontal sinusitis: Secondary | ICD-10-CM

## 2014-01-09 DIAGNOSIS — F17210 Nicotine dependence, cigarettes, uncomplicated: Secondary | ICD-10-CM | POA: Insufficient documentation

## 2014-01-09 DIAGNOSIS — J0101 Acute recurrent maxillary sinusitis: Secondary | ICD-10-CM | POA: Insufficient documentation

## 2014-01-09 MED ORDER — HYDROCODONE-ACETAMINOPHEN 5-325 MG PO TABS
1.0000 | ORAL_TABLET | ORAL | Status: AC | PRN
Start: 2014-01-09 — End: 2014-01-19

## 2014-01-09 MED ORDER — AMOXICILLIN 500 MG PO CAPS
500.0000 mg | ORAL_CAPSULE | Freq: Three times a day (TID) | ORAL | Status: AC
Start: 2014-01-09 — End: 2014-01-19

## 2014-01-09 MED ORDER — HYDROCODONE-ACETAMINOPHEN 5-325 MG PO TABS
2.0000 | ORAL_TABLET | Freq: Once | ORAL | Status: AC
Start: 2014-01-09 — End: 2014-01-09
  Administered 2014-01-09: 2 via ORAL
  Filled 2014-01-09: qty 2

## 2014-01-09 MED ORDER — AMOXICILLIN 500 MG PO CAPS
500.0000 mg | ORAL_CAPSULE | Freq: Once | ORAL | Status: AC
Start: 2014-01-09 — End: 2014-01-09
  Administered 2014-01-09: 500 mg via ORAL
  Filled 2014-01-09: qty 1

## 2014-01-09 MED ORDER — ONDANSETRON 4 MG PO TBDP
4.0000 mg | ORAL_TABLET | Freq: Once | ORAL | Status: AC
Start: 2014-01-09 — End: 2014-01-09
  Administered 2014-01-09: 4 mg via ORAL
  Filled 2014-01-09: qty 1

## 2014-01-09 NOTE — ED Provider Notes (Signed)
EMERGENCY DEPARTMENT HISTORY AND PHYSICAL        Physician/Midlevel provider first contact with patient: 01/09/14 2114       Date: 01/09/2014  Patient Name: Alicia Waters  Chief Complaint:   Chief Complaint   Patient presents with   . Sinus Pain       History of Presenting Illness           History Provided By: the patient and the spouse..  Translation services  were not used.  Sign language services were not used.    HPI    Chief Complaint: Sinus Pain  Onset: 3 days ago  Timing: Constant  Location: Frontal and Maxillary  Quality: Pressure  Severity: Moderate  Modifying Factors: None  Associated Symptoms:  Congestion, Post nasal drip, Yellowish-greenish mucous production, Productive cough, Subjective fever      Sinus Pain  Patient complains of congestion, cough, post nasal drip, sinus pressure and yellowish green mucous production. She also notes subjective fever. Onset of symptoms was 3 days ago. Symptoms have been unchanged since that time.   Past history is significant for frequent sinus infections. Patient is non-smoker. Patient also has a Hx of HTN. Her PSHx includes mitral valve replacement, sinus surgery, and anterior approach discectomy. She is scheduled to see ENT in November as she has been having trouble hearing lately. Patient is currently on ASA daily.      PCP: Carney Living., MD    LMP: No LMP recorded. Patient is not currently having periods (Reason: Menopausal).    GYN HX:   Obstetric History     No data available       Blood Type:         Past History     Past Medical History:  Past Medical History   Diagnosis Date   . Hypertensive disorder    . Thyroid nodule    . Depression    . Mitral valve disorder    . Rheumatic fever            Past Surgical History:  Past Surgical History   Procedure Laterality Date   . Sinus surgery     . Throat biiopsy     . Cesarean section     . Cervical fusion  01/2012   . Mitral valve replacement           Family History:  No family history on  file.        Social History:  History   Substance Use Topics   . Smoking status: Current Every Day Smoker -- 0.50 packs/day for 20 years     Types: Cigarettes   . Smokeless tobacco: Never Used   . Alcohol Use: No         Allergies:  No Known Allergies    No current facility-administered medications for this encounter.     Current Outpatient Prescriptions   Medication Sig Dispense Refill   . amLODIPine (NORVASC) 5 MG tablet Take 5 mg by mouth daily.     Marland Kitchen atorvastatin (LIPITOR) 40 MG tablet Take 1 tablet (40 mg total) by mouth nightly. 30 tablet 0   . baclofen (LIORESAL) 10 MG tablet Take 10 mg by mouth 3 (three) times daily.     . DULoxetine (CYMBALTA) 30 MG capsule Take 30 mg by mouth daily.     . eszopiclone (LUNESTA) 1 MG tablet Take 1 mg by mouth nightly. Take immediately before bedtime     . fexofenadine (ALLEGRA)  30 MG tablet Take 30 mg by mouth daily.     Marland Kitchen ipratropium (ATROVENT) 0.03 % nasal spray 2 sprays by Nasal route every 12 (twelve) hours.     Marland Kitchen losartan-hydrochlorothiazide (HYZAAR) 100-25 MG per tablet Take 1 tablet by mouth daily.     . Multiple Vitamin (MULTIVITAMIN) capsule Take 1 capsule by mouth daily.     . rosuvastatin (CRESTOR) 5 MG tablet Take 5 mg by mouth daily.     Marland Kitchen amoxicillin (AMOXIL) 500 MG capsule Take 1 capsule (500 mg total) by mouth 3 (three) times daily. Make sure to finish ALL of this medication. 30 capsule 0   . aspirin EC 81 MG EC tablet Take 1 tablet (81 mg total) by mouth daily.  0   . Black Cohosh 540 MG Cap Take by mouth 2 (two) times daily.     . Flaxseed, Linseed, (FLAX SEEDS PO) Take by mouth.     Marland Kitchen HYDROcodone-acetaminophen (NORCO) 5-325 MG per tablet Take 1-2 tablets by mouth every 4 (four) hours as needed for Pain. 20 tablet 0   . ibuprofen (ADVIL,MOTRIN) 800 MG tablet Take 800 mg by mouth every 6 (six) hours as needed.     . Tapentadol HCl (NUCYNTA ER) 150 MG Tablet SR 12 hr Take by mouth 2 (two) times daily.     . traZODone (DESYREL) 50 MG tablet Take 50 mg by mouth  nightly.         Review of Systems     Review of Systems   Constitutional: Positive for fever (subjective). Negative for diaphoresis, activity change, appetite change and fatigue.   HENT: Positive for dental problem, hearing loss, postnasal drip and sinus pressure.         + Greenish-yellowish mucous production   Eyes: Negative for pain, discharge, redness and visual disturbance.   Respiratory: Positive for cough (productive). Negative for apnea, chest tightness, shortness of breath, wheezing and stridor.    Cardiovascular: Negative for chest pain, palpitations and leg swelling.   Gastrointestinal: Negative for nausea, vomiting, abdominal pain, diarrhea, blood in stool and abdominal distention.   Musculoskeletal: Negative for myalgias, arthralgias, neck pain and neck stiffness.   Skin: Negative for color change, pallor and rash.   Neurological: Negative for dizziness, seizures, syncope, speech difficulty, weakness, numbness and headaches.   Hematological: Does not bruise/bleed easily.          Physical Exam     Medication List Reviewed: Yes      BP 138/66 mmHg  Pulse 92  Temp(Src) 98.6 F (37 C) (Oral)  Resp 18  SpO2 98%    Pulse Oximetry: 98%  (RA unless specified)      Physical Exam   Constitutional: She is oriented to person, place, and time. She appears well-developed and well-nourished. No distress.   HENT:   Head: Normocephalic and atraumatic.   Right Ear: External ear normal.   Left Ear: External ear normal.   Mouth/Throat: Oropharynx is clear and moist. No oropharyngeal exudate.   Tender to percussion in frontal and maxillary sinuses. Decreased light reflex in bilateral ears. Post nasal drip.   Eyes: Conjunctivae and EOM are normal. Pupils are equal, round, and reactive to light.   Neck: Normal range of motion. Neck supple. No JVD present.   Posterior nontender cervical adenopathy   Cardiovascular: Normal rate, regular rhythm and normal heart sounds.    No murmur heard.  Pulmonary/Chest: Effort normal  and breath sounds normal. No stridor. No respiratory distress. She  has no wheezes. She has no rales. She exhibits no tenderness.   Abdominal: Soft. She exhibits no distension. There is no tenderness. There is no rebound and no guarding.   Musculoskeletal: Normal range of motion.   Neurological: She is alert and oriented to person, place, and time. No cranial nerve deficit.   Skin: Skin is warm and dry. No rash noted. She is not diaphoretic. No erythema. No pallor.   Psychiatric: She has a normal mood and affect. Her behavior is normal. Judgment and thought content normal.   Nursing note and vitals reviewed.      Diagnostic Study Results     Labs -     Results     ** No results found for the last 24 hours. **              Radiologic Studies -   Radiology Results (24 Hour)     ** No results found for the last 24 hours. **          EKG/MONITOR    Current EKG: not performed    Old EKG:   not reviewed or none available    Cardiac Monitor: not monitored      .      Medical Decision Making   I am the first provider for this patient.    I reviewed the vital signs, available nursing notes, past medical history, past surgical history, family history and social history.      Records Review: None       ED Course:     9:50 PM - Discussed test results with pt and pt's husband and counseled on diagnosis, f/u plans, and signs and symptoms when to return to ED.  Pt is stable and ready for discharge.        Provider Notes:       Procedures: none      Core Measures:        Critical Care Time: none          Diagnosis     Clinical Impression:  Primary diagnosis is in boldface  1. Acute recurrent frontal and maxillary sinusitis        DISPOSITION    discharged home     DISPOSITION CONDITION    improved    Vital Signs-.     No data found.            _______________________________    Attestations:    This note is prepared by Robbie Lis, acting as Scribe for Gaylord Shih, MD.    Gaylord Shih, MD. The scribe's documentation has been  prepared under my direction and personally reviewed by me in its entirety.  I confirm that the note above accurately reflects all work, treatment, procedures, and medical decision making performed by me.    _______________________________      CHART RECONCILIATION: CHART OWNERSHIP: Dr. Gaylord Shih is the primary emergency physician of record.              Harless Litten, MD  01/11/14 1230

## 2014-01-09 NOTE — ED Notes (Signed)
Green discharge from nose, frontal sinus pressure since MOnday

## 2014-01-09 NOTE — Discharge Instructions (Signed)
Sinusitis     You have been seen for a sinus infection.     Sinus infections are common. They often happen after people have a virus or a common cold.     Symptoms are: Pain in the face, green or yellow mucous from the nose, fever (temperature higher than 100.4ºF / 38ºC) and chills. There may also be a feeling of fullness or pressure in the face.     Decongestants may be used to help the sinuses drain. Sometimes a steroid spray is used.     You may need antibiotics for the infection. Not all cases of sinusitis need antibiotics. Viruses cause most sinusitis. Antibiotics do not work on viruses. Sometimes sinusitis and be caused by bacteria. These cases usually get better with medicine to help the sinuses drain. Antibiotics should be given only to patients who have symptoms for more than 2 weeks and if they have fever, severe sinus pain and pus mixed in with the mucus.     YOU SHOULD SEEK MEDICAL ATTENTION IMMEDIATELY, EITHER HERE OR AT THE NEAREST EMERGENCY DEPARTMENT, IF ANY OF THE FOLLOWING OCCURS:  · You do not get better with treatment.  · You have severe headaches and high fever (temperature higher than 100.4ºF / 38ºC) or any confusion.  · You have worse pain in the face. Your face gets more swollen.

## 2014-04-16 ENCOUNTER — Emergency Department: Payer: Enrolled Prime—HMO

## 2014-04-16 ENCOUNTER — Emergency Department
Admission: EM | Admit: 2014-04-16 | Discharge: 2014-04-16 | Disposition: A | Discharge status: Home / Self Care | Payer: Enrolled Prime—HMO | Attending: Emergency Medicine | Admitting: Emergency Medicine

## 2014-04-16 DIAGNOSIS — R002 Palpitations: Secondary | ICD-10-CM | POA: Insufficient documentation

## 2014-04-16 LAB — CBC AND DIFFERENTIAL
Basophils Absolute Automated: 0 10*3/uL (ref 0.00–0.20)
Basophils Automated: 0 %
Eosinophils Absolute Automated: 0.02 10*3/uL (ref 0.00–0.70)
Eosinophils Automated: 0 %
Hematocrit: 40.6 % (ref 37.0–47.0)
Hgb: 13.3 g/dL (ref 12.0–16.0)
Immature Granulocytes Absolute: 0.03 10*3/uL
Immature Granulocytes: 0 %
Lymphocytes Absolute Automated: 1.42 10*3/uL (ref 0.50–4.40)
Lymphocytes Automated: 12 %
MCH: 30.4 pg (ref 28.0–32.0)
MCHC: 32.8 g/dL (ref 32.0–36.0)
MCV: 92.7 fL (ref 80.0–100.0)
MPV: 8.8 fL — ABNORMAL LOW (ref 9.4–12.3)
Monocytes Absolute Automated: 0.9 10*3/uL (ref 0.00–1.20)
Monocytes: 8 %
Neutrophils Absolute: 9.52 10*3/uL — ABNORMAL HIGH (ref 1.80–8.10)
Neutrophils: 80 %
Nucleated RBC: 0 /100 WBC (ref 0–1)
Platelets: 249 10*3/uL (ref 140–400)
RBC: 4.38 10*6/uL (ref 4.20–5.40)
RDW: 15 % (ref 12–15)
WBC: 11.86 10*3/uL — ABNORMAL HIGH (ref 3.50–10.80)

## 2014-04-16 LAB — COMPREHENSIVE METABOLIC PANEL
ALT: 14 U/L (ref 0–55)
AST (SGOT): 18 U/L (ref 5–34)
Albumin/Globulin Ratio: 1 (ref 0.9–2.2)
Albumin: 3.4 g/dL — ABNORMAL LOW (ref 3.5–5.0)
Alkaline Phosphatase: 99 U/L (ref 37–106)
Anion Gap: 10 (ref 5.0–15.0)
BUN: 10 mg/dL (ref 7.0–19.0)
Bilirubin, Total: 0.3 mg/dL (ref 0.2–1.2)
CO2: 25 mEq/L (ref 22–29)
Calcium: 9.2 mg/dL (ref 8.5–10.5)
Chloride: 104 mEq/L (ref 100–111)
Creatinine: 0.9 mg/dL (ref 0.6–1.0)
Globulin: 3.3 g/dL (ref 2.0–3.6)
Glucose: 169 mg/dL — ABNORMAL HIGH (ref 70–100)
Potassium: 3.8 mEq/L (ref 3.5–5.1)
Protein, Total: 6.7 g/dL (ref 6.0–8.3)
Sodium: 139 mEq/L (ref 136–145)

## 2014-04-16 LAB — GFR: EGFR: >60

## 2014-04-16 LAB — TROPONIN I: Troponin I: <0.01 ng/mL (ref 0.00–0.09)

## 2014-04-16 LAB — HEMOLYSIS INDEX: Hemolysis Index: 9 (ref 0–18)

## 2014-04-16 MED ORDER — SODIUM CHLORIDE 0.9 % IV BOLUS
1000.0000 mL | Freq: Once | INTRAVENOUS | Status: AC
Start: 2014-04-16 — End: 2014-04-16
  Administered 2014-04-16: 1000 mL via INTRAVENOUS

## 2014-04-16 NOTE — Discharge Instructions (Signed)
Palpitations     You have been diagnosed with "palpitations."     Palpitations are beats in the chest that feel funny or strange. They are often caused by extra heartbeats that come earlier than normal. These are either "premature atrial contractions" or "premature ventricular contractions," depending on where in the heart they happen. Palpitations often go away on their own and often cause no serious problems. They are sometimes related to stress or lack of sleep. They can also be related too much caffeine. These symptoms can be caused by many over-the-counter (no prescription needed) cold medicines, diet pills and "natural" vitamin supplements with stimulants, often ephedrine (Ephedrine is also known by its traditional Chinese name, Ma huang).     Palpitations feel different for different people. Some patients describe a feeling of "butterflies" in the chest. Others say it feels like the heart is "flipping over" in the chest. Palpitations may happen often but should last only a second or two each time. They should not cause any chest pain, lightheadedness, dizziness or fainting.     There is no specific treatment for palpitations. However, you should avoid all caffeine and cold medications. Also avoid all natural stimulants and chocolate.     Follow up with your primary doctor in the next week to make sure your symptoms are getting better. In some cases, a Holter monitor may be ordered. A Holter monitor is a portable heart monitor. It records your heart's electrical rhythm. You will need to see your regular doctor to get the results from the test.     YOU SHOULD SEEK MEDICAL ATTENTION IMMEDIATELY, EITHER HERE OR AT THE NEAREST EMERGENCY DEPARTMENT, IF ANY OF THE FOLLOWING OCCURS:  · Lightheadedness or the feeling you might faint.  · An unusually fast or slow heart rate.  · Chest pain or shortness of breath.  · Palpitations increase when you exercise.  · Any other worsening symptoms or concerns.

## 2014-04-16 NOTE — ED Provider Notes (Signed)
Physician/Midlevel provider first contact with patient: 04/16/14 0426         Pt presents with palpitations that started about an hour prior to arrival.  Pt states that she was trying to lie down to go to sleep.  Pt feels like her heart is beating rapidly.  Pt states that when this has happened in the past, it has not lasted as long.  Pt states that she had epidural injection done yesterday.  Pt was NPO prior to the procedure, but ate a normal amount at dinner last night.  No vomiting.  No fever.  Hx from the pt.      Review of Systems   Constitutional: Negative.    HENT: Negative.    Respiratory: Negative.    Cardiovascular: Positive for palpitations. Negative for leg swelling.   Gastrointestinal: Negative.    All other systems reviewed and are negative.    Physical Exam   Constitutional: She is oriented to person, place, and time. She appears well-developed and well-nourished. No distress.   HENT:   Head: Normocephalic and atraumatic.   Neck: Normal range of motion. Neck supple.   Cardiovascular: Normal rate and regular rhythm.    Pulmonary/Chest: Effort normal and breath sounds normal.   Abdominal: Soft. She exhibits no distension. There is no tenderness.   Musculoskeletal: Normal range of motion. She exhibits no edema.   Neurological: She is alert and oriented to person, place, and time.   Nursing note and vitals reviewed.    Oxygen saturations are 98%    EKG: normal sinus rhythm, nl axis, nl intervals, no ischemic changes.  Cardiac monitor: nsr rate 90-100    EKG (#2): normal sinus rhythm, nl axis, no ischemic changes.      5:30 AM  Pt comfortable.  Still in NSR, rate 90s    6:11 AM  Pt comfortable.  Still NSR.  Will d/c home.  Pt to f/u with her cardiologist.    Impression:  1. Palpitations          Jethro Bastos, MD  04/16/14 [phone]

## 2014-04-16 NOTE — ED Notes (Signed)
Bed: GR7  Expected date:   Expected time:   Means of arrival:   Comments:  CN Hold

## 2014-04-16 NOTE — ED Notes (Signed)
Pt. Arrives from home c/o sudden onset palpitations L anterior chest w/ slight retrosternal CP and L shoulder pain beginning ~3:40 am. Pt. States slight dyspnea, no visible respiratory distress. Pt. Notable hx of mitral valve replacement 10 months ago w/ uncomplicated recovery. Pt. Denies n/v, blurred vision, dizziness, LOC, diarrhea, recent illness. Aox4.

## 2014-04-17 LAB — ECG 12-LEAD
Atrial Rate: 99 {beats}/min
Atrial Rate: 93 {beats}/min
P Axis: 72 degrees
P Axis: 72 degrees
P-R Interval: 154 ms
P-R Interval: 160 ms
Q-T Interval: 370 ms
Q-T Interval: 410 ms
QRS Duration: 84 ms
QRS Duration: 84 ms
QTC Calculation (Bezet): 474 ms
QTC Calculation (Bezet): 509 ms
R Axis: 53 degrees
R Axis: 58 degrees
T Axis: 78 degrees
T Axis: 78 degrees
Ventricular Rate: 99 {beats}/min
Ventricular Rate: 93 {beats}/min

## 2014-05-25 ENCOUNTER — Emergency Department: Payer: Enrolled Prime—HMO

## 2014-05-25 ENCOUNTER — Observation Stay
Admission: EM | Admit: 2014-05-25 | Discharge: 2014-05-26 | Disposition: A | Discharge status: Home / Self Care | Payer: Enrolled Prime—HMO | Attending: Family Medicine | Admitting: Family Medicine

## 2014-05-25 ENCOUNTER — Observation Stay: Payer: Enrolled Prime—HMO | Admitting: Family Medicine

## 2014-05-25 DIAGNOSIS — M545 Low back pain, unspecified: Secondary | ICD-10-CM | POA: Diagnosis present

## 2014-05-25 DIAGNOSIS — K219 Gastro-esophageal reflux disease without esophagitis: Secondary | ICD-10-CM | POA: Insufficient documentation

## 2014-05-25 DIAGNOSIS — Z952 Presence of prosthetic heart valve: Secondary | ICD-10-CM | POA: Insufficient documentation

## 2014-05-25 DIAGNOSIS — Z7982 Long term (current) use of aspirin: Secondary | ICD-10-CM | POA: Insufficient documentation

## 2014-05-25 DIAGNOSIS — R079 Chest pain, unspecified: Principal | ICD-10-CM | POA: Diagnosis present

## 2014-05-25 DIAGNOSIS — F329 Major depressive disorder, single episode, unspecified: Secondary | ICD-10-CM | POA: Insufficient documentation

## 2014-05-25 DIAGNOSIS — I10 Essential (primary) hypertension: Secondary | ICD-10-CM | POA: Diagnosis present

## 2014-05-25 DIAGNOSIS — F17210 Nicotine dependence, cigarettes, uncomplicated: Secondary | ICD-10-CM | POA: Insufficient documentation

## 2014-05-25 DIAGNOSIS — F32A Depression, unspecified: Secondary | ICD-10-CM

## 2014-05-25 DIAGNOSIS — E785 Hyperlipidemia, unspecified: Secondary | ICD-10-CM | POA: Diagnosis present

## 2014-05-25 LAB — COMPREHENSIVE METABOLIC PANEL
ALT: 22 U/L (ref 0–55)
AST (SGOT): 21 U/L (ref 5–34)
Albumin/Globulin Ratio: 1.1 (ref 0.9–2.2)
Albumin: 3.7 g/dL (ref 3.5–5.0)
Alkaline Phosphatase: 98 U/L (ref 37–106)
Anion Gap: 10 (ref 5.0–15.0)
BUN: 10 mg/dL (ref 7.0–19.0)
Bilirubin, Total: 0.4 mg/dL (ref 0.2–1.2)
CO2: 26 mEq/L (ref 22–29)
Calcium: 9.3 mg/dL (ref 8.5–10.5)
Chloride: 102 mEq/L (ref 100–111)
Creatinine: 0.8 mg/dL (ref 0.6–1.0)
Globulin: 3.3 g/dL (ref 2.0–3.6)
Glucose: 96 mg/dL (ref 70–100)
Potassium: 3.6 mEq/L (ref 3.5–5.1)
Protein, Total: 7 g/dL (ref 6.0–8.3)
Sodium: 138 mEq/L (ref 136–145)

## 2014-05-25 LAB — CBC AND DIFFERENTIAL
Basophils Absolute Automated: 0.01 10*3/uL (ref 0.00–0.20)
Basophils Automated: 0 %
Eosinophils Absolute Automated: 0.05 10*3/uL (ref 0.00–0.70)
Eosinophils Automated: 1 %
Hematocrit: 42.5 % (ref 37.0–47.0)
Hgb: 14.6 g/dL (ref 12.0–16.0)
Immature Granulocytes Absolute: 0.01 10*3/uL
Immature Granulocytes: 0 %
Lymphocytes Absolute Automated: 1.91 10*3/uL (ref 0.50–4.40)
Lymphocytes Automated: 30 %
MCH: 31.7 pg (ref 28.0–32.0)
MCHC: 34.4 g/dL (ref 32.0–36.0)
MCV: 92.2 fL (ref 80.0–100.0)
MPV: 8.8 fL — ABNORMAL LOW (ref 9.4–12.3)
Monocytes Absolute Automated: 0.62 10*3/uL (ref 0.00–1.20)
Monocytes: 10 %
Neutrophils Absolute: 3.8 10*3/uL (ref 1.80–8.10)
Neutrophils: 59 %
Nucleated RBC: 0 /100 WBC (ref 0–1)
Platelets: 227 10*3/uL (ref 140–400)
RBC: 4.61 10*6/uL (ref 4.20–5.40)
RDW: 15 % (ref 12–15)
WBC: 6.39 10*3/uL (ref 3.50–10.80)

## 2014-05-25 LAB — GFR: EGFR: >60

## 2014-05-25 LAB — TROPONIN I
Troponin I: <0.01 ng/mL (ref 0.00–0.09)
Troponin I: 0.01 ng/mL (ref 0.00–0.09)

## 2014-05-25 LAB — HEMOLYSIS INDEX: Hemolysis Index: 11 (ref 0–18)

## 2014-05-25 MED ORDER — ROSUVASTATIN CALCIUM 10 MG PO TABS
5.0000 mg | ORAL_TABLET | ORAL | Status: DC
Start: 2014-05-25 — End: 2014-05-26
  Administered 2014-05-25: 5 mg via ORAL
  Filled 2014-05-25: qty 1

## 2014-05-25 MED ORDER — TRAZODONE HCL 50 MG PO TABS
50.0000 mg | ORAL_TABLET | Freq: Every evening | ORAL | Status: DC
Start: 2014-05-25 — End: 2014-05-26
  Administered 2014-05-25: 50 mg via ORAL
  Filled 2014-05-25: qty 1

## 2014-05-25 MED ORDER — HYDROMORPHONE HCL 2 MG PO TABS
2.0000 mg | ORAL_TABLET | ORAL | Status: DC | PRN
Start: 2014-05-25 — End: 2014-05-26
  Administered 2014-05-25: 2 mg via ORAL
  Filled 2014-05-25: qty 1

## 2014-05-25 MED ORDER — NITROGLYCERIN 0.4 MG SL SUBL
0.4000 mg | SUBLINGUAL_TABLET | SUBLINGUAL | Status: AC
Start: 2014-05-25 — End: 2014-05-25
  Administered 2014-05-25 (×3): 0.4 mg via SUBLINGUAL
  Filled 2014-05-25: qty 1

## 2014-05-25 MED ORDER — ASPIRIN 81 MG PO TBEC
81.0000 mg | DELAYED_RELEASE_TABLET | Freq: Every day | ORAL | Status: DC
Start: 2014-05-26 — End: 2014-05-26
  Administered 2014-05-26: 81 mg via ORAL
  Filled 2014-05-25: qty 1

## 2014-05-25 MED ORDER — SODIUM CHLORIDE 0.9 % IV BOLUS
1000.0000 mL | Freq: Once | INTRAVENOUS | Status: AC
Start: 2014-05-25 — End: 2014-05-25
  Administered 2014-05-25: 1000 mL via INTRAVENOUS

## 2014-05-25 MED ORDER — AMLODIPINE BESYLATE 5 MG PO TABS
5.0000 mg | ORAL_TABLET | Freq: Every day | ORAL | Status: DC
Start: 2014-05-26 — End: 2014-05-26
  Administered 2014-05-26: 5 mg via ORAL
  Filled 2014-05-25: qty 1

## 2014-05-25 MED ORDER — BACLOFEN 10 MG PO TABS
10.0000 mg | ORAL_TABLET | Freq: Three times a day (TID) | ORAL | Status: DC
Start: 2014-05-25 — End: 2014-05-26
  Administered 2014-05-25 – 2014-05-26 (×2): 10 mg via ORAL
  Filled 2014-05-25 (×2): qty 1

## 2014-05-25 MED ORDER — DULOXETINE HCL 30 MG PO CPEP
30.0000 mg | ORAL_CAPSULE | ORAL | Status: DC
Start: 2014-05-25 — End: 2014-05-26
  Administered 2014-05-25: 30 mg via ORAL
  Filled 2014-05-25: qty 1

## 2014-05-25 MED ORDER — NITROGLYCERIN 2 % TD OINT
0.5000 [in_us] | TOPICAL_OINTMENT | TRANSDERMAL | Status: DC
Start: 2014-05-25 — End: 2014-05-25

## 2014-05-25 MED ORDER — NALOXONE HCL 0.4 MG/ML IJ SOLN
0.2000 mg | INTRAMUSCULAR | Status: DC | PRN
Start: 2014-05-25 — End: 2014-05-26

## 2014-05-25 MED ORDER — LIDOCAINE 5 % EX PTCH
1.0000 | MEDICATED_PATCH | CUTANEOUS | Status: DC
Start: 2014-05-25 — End: 2014-05-26
  Administered 2014-05-25: 1 via TRANSDERMAL
  Filled 2014-05-25: qty 1

## 2014-05-25 MED ORDER — LISINOPRIL 5 MG PO TABS
ORAL_TABLET | Freq: Every day | ORAL | Status: DC
Start: 2014-05-26 — End: 2014-05-26
  Filled 2014-05-25 (×2): qty 1

## 2014-05-25 MED ORDER — INFLUENZA VAC SPLIT QUAD 0.5 ML IM SUSY
0.5000 mL | PREFILLED_SYRINGE | Freq: Once | INTRAMUSCULAR | Status: DC
Start: 2014-05-25 — End: 2014-05-26
  Filled 2014-05-25: qty 0.5

## 2014-05-25 MED ORDER — ENOXAPARIN SODIUM 40 MG/0.4ML SC SOLN
40.0000 mg | Freq: Every day | SUBCUTANEOUS | Status: DC
Start: 2014-05-26 — End: 2014-05-26
  Filled 2014-05-25: qty 0.4

## 2014-05-25 NOTE — ED Provider Notes (Signed)
EMERGENCY DEPARTMENT HISTORY AND PHYSICAL EXAM     Physician/Midlevel provider first contact with patient: 05/25/14 1657         Date: 05/25/2014  Patient Name: Alicia Waters    History of Presenting Illness     Chief Complaint   Patient presents with   . Chest Pain       History Provided By: Patient    Chief Complaint: Chest pain  Onset: Waxing and Waning  Timing: 1 week  Location: Left sided  Quality: Sharp  Severity: Moderate  Modifying Factors: Improved with lying flat  Associated Symptoms: +palpitations and nausea     Additional History: Alicia Waters is a 54 y.o. female who presents to the ED for evaluation of waxing and waning, sharp, left sided, chest pain for 1 week. The pain is moderate in severity and improved with laying flat. She also reports palpitations and nausea. She denies vomiting, diarrhea, cough, leg swelling, or urinary complaints. She was seen 1 month ago for similar symptoms and they told her "it may have been due to anesthesia from a procedure." She also reports nausea, however denies vomiting. She also denies diarrhea, SOB, coughing, leg swelling, or urinary complaints. Pt does have a h/o of mitral valve replacement. She did have a stress test approx 9 months ago. She did take ASA today. She is a current smoker.    PCP: Carney Living., MD   Cardiology: Dr. Valarie Cones      No current facility-administered medications for this encounter.     Current Outpatient Prescriptions   Medication Sig Dispense Refill   . amLODIPine (NORVASC) 5 MG tablet Take 5 mg by mouth daily.     Marland Kitchen aspirin 325 MG EC tablet Take 325 mg by mouth every 6 (six) hours as needed for Pain.     Marland Kitchen aspirin EC 81 MG EC tablet Take 1 tablet (81 mg total) by mouth daily.  0   . atorvastatin (LIPITOR) 40 MG tablet Take 1 tablet (40 mg total) by mouth nightly. 30 tablet 0   . baclofen (LIORESAL) 10 MG tablet Take 10 mg by mouth 3 (three) times daily.     . Black Cohosh 540 MG Cap Take by mouth 2 (two) times daily.     .  DULoxetine (CYMBALTA) 30 MG capsule Take 30 mg by mouth daily.     . eszopiclone (LUNESTA) 1 MG tablet Take 1 mg by mouth nightly. Take immediately before bedtime     . fexofenadine (ALLEGRA) 30 MG tablet Take 30 mg by mouth daily.     . Flaxseed, Linseed, (FLAX SEEDS PO) Take by mouth.     Marland Kitchen HYDROmorphone (DILAUDID) 4 MG tablet Take 4 mg by mouth every 4 (four) hours as needed for Pain.     Marland Kitchen ipratropium (ATROVENT) 0.03 % nasal spray 2 sprays by Nasal route every 12 (twelve) hours.     . lidocaine (LIDODERM) 5 % Place 1 patch onto the skin daily. Remove & Discard patch within 12 hours or as directed by MD     . losartan-hydrochlorothiazide (HYZAAR) 100-25 MG per tablet Take 1 tablet by mouth daily.     . Multiple Vitamin (MULTIVITAMIN) capsule Take 1 capsule by mouth daily.     . rosuvastatin (CRESTOR) 5 MG tablet Take 5 mg by mouth daily.     . Tapentadol HCl (NUCYNTA ER) 150 MG Tablet SR 12 hr Take by mouth 2 (two) times daily.     Marland Kitchen  traZODone (DESYREL) 50 MG tablet Take 50 mg by mouth nightly.         Past History     Past Medical History:  Past Medical History   Diagnosis Date   . Hypertensive disorder    . Thyroid nodule    . Depression    . Mitral valve disorder    . Rheumatic fever        Past Surgical History:  Past Surgical History   Procedure Laterality Date   . Sinus surgery     . Throat biiopsy     . Cesarean section     . Cervical fusion  01/2012   . Mitral valve replacement         Family History:  History reviewed. No pertinent family history.    Social History:  History   Substance Use Topics   . Smoking status: Current Every Day Smoker -- 1.00 packs/day for 20 years     Types: Cigarettes   . Smokeless tobacco: Never Used   . Alcohol Use: No       Allergies:  No Known Allergies    Review of Systems     Review of Systems   Constitutional: Negative for fever and chills.   HENT: Negative for nosebleeds.    Eyes: Negative for discharge and redness.   Respiratory: Negative for cough and shortness of  breath.    Cardiovascular: Positive for chest pain and palpitations. Negative for leg swelling.   Gastrointestinal: Positive for nausea. Negative for vomiting.   Genitourinary: Negative for dysuria and frequency.   Neurological: Negative for tremors.   Endo/Heme/Allergies:        NKDA   Psychiatric/Behavioral: Positive for substance abuse (Tobacco). Negative for suicidal ideas.     Physical Exam   BP 126/76 mmHg  Pulse 87  Temp(Src) 99.2 F (37.3 C) (Oral)  Resp 16  Ht 5\' 3"  (1.6 m)  Wt 72.576 kg  BMI 28.35 kg/m2  SpO2 99%    Physical Exam   Constitutional: She is well-developed, well-nourished, and in no distress.   Appears younger than stated age   HENT:   Head: Normocephalic and atraumatic.   Mouth/Throat: Oropharynx is clear and moist.   Eyes: Conjunctivae are normal. Pupils are equal, round, and reactive to light.   Cardiovascular: Normal rate, regular rhythm and normal heart sounds.    No murmur heard.  Pulmonary/Chest: Effort normal and breath sounds normal. No respiratory distress.   Abdominal: Soft. She exhibits no distension. There is no tenderness.   Musculoskeletal: Normal range of motion. She exhibits no edema.   Lymphadenopathy:     She has no cervical adenopathy.   Neurological: She is alert. GCS score is 15.   Skin: Skin is warm and dry.   Psychiatric: Mood and affect normal.   Nursing note and vitals reviewed.      Diagnostic Study Results     Labs -     Results     Procedure Component Value Units Date/Time    Troponin I [[id number]] Collected:  05/25/14 1640    Specimen Information:  Blood Updated:  05/25/14 1721     Troponin I <0.01 ng/mL     Comprehensive metabolic panel [[id number]] Collected:  05/25/14 1640    Specimen Information:  Blood Updated:  05/25/14 1715     Glucose 96 mg/dL      BUN 62.1 mg/dL      Creatinine 0.8 mg/dL      Sodium 308 mEq/L  Potassium 3.6 mEq/L      Chloride 102 mEq/L      CO2 26 mEq/L      CALCIUM 9.3 mg/dL      Protein, Total 7.0 g/dL      Albumin 3.7 g/dL       AST (SGOT) 21 U/L      ALT 22 U/L      Alkaline Phosphatase 98 U/L      Bilirubin, Total 0.4 mg/dL      Globulin 3.3 g/dL      Albumin/Globulin Ratio 1.1      Anion Gap 10.0     Hemolysis index [[id number]] Collected:  05/25/14 1640     Hemolysis Index 11 Updated:  05/25/14 1715    GFR [[id number]] Collected:  05/25/14 1640     EGFR >60.0 Updated:  05/25/14 1715    CBC with differential [[id number]]  (Abnormal) Collected:  05/25/14 1640    Specimen Information:  Blood / Blood Updated:  05/25/14 1700     WBC 6.39 x10 3/uL      Hgb 14.6 g/dL      Hematocrit 08.6 %      Platelets 227 x10 3/uL      RBC 4.61 x10 6/uL      MCV 92.2 fL      MCH 31.7 pg      MCHC 34.4 g/dL      RDW 15 %      MPV 8.8 (L) fL      Neutrophils 59 %      Lymphocytes Automated 30 %      Monocytes 10 %      Eosinophils Automated 1 %      Basophils Automated 0 %      Immature Granulocyte 0 %      Nucleated RBC 0 /100 WBC      Neutrophils Absolute 3.80 x10 3/uL      Abs Lymph Automated 1.91 x10 3/uL      Abs Mono Automated 0.62 x10 3/uL      Abs Eos Automated 0.05 x10 3/uL      Absolute Baso Automated 0.01 x10 3/uL      Absolute Immature Granulocyte 0.01 x10 3/uL           Radiologic Studies -   Radiology Results (24 Hour)     Procedure Component Value Units Date/Time    XR Chest  AP Portable [[id number]] Collected:  05/25/14 1654    Order Status:  Completed Updated:  05/25/14 1659    Narrative:      HISTORY: Chest pain. Initial encounter.    EXAMINATION: Portable frontal view of chest at 1652    COMPARISON: 05/06/2013     FINDINGS: There is mild bibasilar atelectasis. No acute edema. Median  sternotomy wires indicate previous cardiothoracic surgery. Cardiac  silhouette not enlarged. Redemonstration of inferior cervical spinal  plate and screw fusion devices.      Impression:       Bibasilar atelectasis.    Ivin Poot, MD   05/25/2014 4:55 PM        .      Medical Decision Making   I am the first provider for this patient.    I reviewed the vital  signs, available nursing notes, past medical history, past surgical history, family history and social history.    Vital Signs-Reviewed the patient's vital signs.     Patient Vitals for the past 12 hrs:   BP Temp Pulse  Resp   05/25/14 1837 126/76 mmHg - 87 16   05/25/14 1744 114/63 mmHg - 87 18   05/25/14 1737 113/62 mmHg - 83 16   05/25/14 1733 127/66 mmHg - 74 16   05/25/14 1606 134/81 mmHg 99.2 F (37.3 C) 87 18       Pulse Oximetry Analysis - Normal 96% on RA    Old Medical Records: Old medical records.  Nursing notes.     EKG:  Interpreted by the Emergency Physician.   Time Interpreted: 1852   Rate: 80   Rhythm: Normal Sinus Rhythm    Interpretation: no ischemic st segment abnormality, QT mildly prolonged   Comparison: 04/16/2014 - unchanged      ED Course:     4:55 PM -This patient was seen and evaluated by myself upon arrival to the ED. They were informed of the treatment plan and need for diagnostic studies. The patient agrees with all discussed material and all concerns were addressed before moving forward.     5:04 PM - IV established and pt given Nitro and IVF    5:40 PM - Pt reports improvement with Nitro. She denies any CP currently.     6:00 PM - Pt informed of diagnostic results. Discussed plan for hospitalization and she agrees with plan.     6:15 PM - Discussed pt case with Dr. Dario Guardian, [place], who agrees with hospitalization.     6:59 PM - Case d/w Dr. Donnie Coffin    Provider Notes:     Diagnosis     Clinical Impression:   1. Chest pain, rule out acute myocardial infarction        _______________________________    Attestations:  This note is prepared by Christel Mormon acting as Scribe for Chrissie Noa, MD    Chrissie Noa, MD The scribe's documentation has been prepared under my direction and personally reviewed by me in its entirety.  I confirm that the note above accurately reflects all work, treatment, procedures, and medical decision making performed by  me.    _______________________________          Lorenza Cambridge, MD  05/27/14 2232

## 2014-05-25 NOTE — ED Notes (Signed)
Pt. Reports anterior left sided CP, non radiating onset several days ago.  Is varying in intensity but now rated at 5. Some sweating.  No nausea.  Hx mitral valve replacement.  EKG done at triage.

## 2014-05-25 NOTE — H&P (Signed)
[facility]      Patient: Alicia Waters  Date: 05/25/2014   DOB: 07-27-1960  Admission Date: 05/25/2014   MRN: [id number]  Attending: Deliah Goody         Chief Complaint   Patient presents with   . Chest Pain      History Gathered From: Patient    HISTORY AND PHYSICAL     Alicia Waters is a 54 y.o. female with a PMHx of HTN, MVP, chronic back and neck pain, depression, hyperlipidemia who presented with chest pain.     The discomfort is described as sharp and most severe in mid chest and left chest.  There is radiation to the left arm.  There is no  change in location over time.  Onset of symptoms was gradual, worsening course since that time. The episodes are brought on by no aggravating factor known by patient and relieved by rest.   The patient also complains of nausea and palpitation. The patient denies fever, dyspnea, hemoptysis, vomiting and leg swelling.   Ms Mccamish has chest pain for more than one week, gradually worsening, started radiating to left arm and more severe today, she came to ER as pain was getting worse. She reports pain 5/10 and sharp.    Past Medical History   Diagnosis Date   . Hypertensive disorder    . Thyroid nodule    . Depression    . Mitral valve disorder    . Rheumatic fever        Past Surgical History   Procedure Laterality Date   . Sinus surgery     . Throat biiopsy     . Cesarean section     . Cervical fusion  01/2012   . Mitral valve replacement         Prior to Admission medications    Medication Sig Start Date End Date Taking? Authorizing Provider   amLODIPine (NORVASC) 5 MG tablet Take 5 mg by mouth daily.    [provider]   aspirin 325 MG EC tablet Take 325 mg by mouth every 6 (six) hours as needed for Pain.    [provider]   aspirin EC 81 MG EC tablet Take 1 tablet (81 mg total) by mouth daily. 02/12/13   Vedia Coffer A, DO   atorvastatin (LIPITOR) 40 MG tablet Take 1 tablet (40 mg total) by mouth nightly. 02/12/13   Vedia Coffer A, DO    baclofen (LIORESAL) 10 MG tablet Take 10 mg by mouth 3 (three) times daily.    [provider]   Black Cohosh 540 MG Cap Take by mouth 2 (two) times daily.    [provider]   DULoxetine (CYMBALTA) 30 MG capsule Take 30 mg by mouth daily.    [provider]   eszopiclone (LUNESTA) 1 MG tablet Take 1 mg by mouth nightly. Take immediately before bedtime    [provider]   fexofenadine (ALLEGRA) 30 MG tablet Take 30 mg by mouth daily.    [provider]   Flaxseed, Linseed, (FLAX SEEDS PO) Take by mouth.    [provider]   HYDROmorphone (DILAUDID) 4 MG tablet Take 4 mg by mouth every 4 (four) hours as needed for Pain.    [provider]   ipratropium (ATROVENT) 0.03 % nasal spray 2 sprays by Nasal route every 12 (twelve) hours.    [provider]   lidocaine (LIDODERM) 5 % Place 1 patch onto  the skin daily. Remove & Discard patch within 12 hours or as directed by MD    [provider]   losartan-hydrochlorothiazide (HYZAAR) 100-25 MG per tablet Take 1 tablet by mouth daily.    [provider]   Multiple Vitamin (MULTIVITAMIN) capsule Take 1 capsule by mouth daily.    [provider]   rosuvastatin (CRESTOR) 5 MG tablet Take 5 mg by mouth daily.    [provider]   Tapentadol HCl (NUCYNTA ER) 150 MG Tablet SR 12 hr Take by mouth 2 (two) times daily.    [provider]   traZODone (DESYREL) 50 MG tablet Take 50 mg by mouth nightly.    [provider]       No Known Allergies    CODE STATUS: Full Code    PRIMARY CARE MD: Carney Living., MD    History reviewed. No pertinent family history.    History   Substance Use Topics   . Smoking status: Current Every Day Smoker -- 1.00 packs/day for 20 years     Types: Cigarettes   . Smokeless tobacco: Never Used   . Alcohol Use: No       REVIEW OF SYSTEMS   Positive for: Chest pain, nausea.  Negative for: Cough, vomiting, shortness of  breath  All review of systems completed.    PHYSICAL EXAM     Vital Signs (most recent): BP 126/76 mmHg  Pulse 87  Temp(Src) 99.2 F (37.3 C) (Oral)  Resp 16  Ht 1.6 m (5\' 3" )  Wt 72.576 kg (160 lb)  BMI 28.35 kg/m2  SpO2 99%  Constitutional: No apparent distress. Patient speaks freely in full sentences.   HEENT: NC/AT, PERRL, no scleral icterus or conjunctival pallor, no nasal discharge, MMM, oropharynx without erythema or exudate  Neck: trachea midline, supple, no cervical or supraclavicular lymphadenopathy or masses  Cardiovascular: RRR, normal S1 S2, no murmurs, gallops, palpable thrills, no JVD, Non-displaced PMI.  Respiratory: Normal rate. No retractions or increased work of breathing. Clear to auscultation and percussion bilaterally.  Gastrointestinal: +BS, non-distended, soft, non-tender, no rebound or guarding, no hepatosplenomegaly  Genitourinary: no suprapubic or costovertebral angle tenderness  Musculoskeletal: ROM and motor strength grossly normal. No clubbing, edema, or cyanosis. DP and radial pulses 2+ and symmetric.  Skin: no rashes, jaundice or other lesions  Neurologic: EOMI, CN 2-12 grossly intact. no gross motor or sensory deficits  Psychiatric: AAOx3, affect and mood appropriate. The patient is alert, interactive, appropriate.    LABS & IMAGING     Recent Results (from the past 24 hour(s))   CBC with differential    Collection Time: 05/25/14  4:40 PM   Result Value Ref Range    WBC 6.39 3.50 - 10.80 x10 3/uL    Hgb 14.6 12.0 - 16.0 g/dL    Hematocrit 16.1 09.6 - 47.0 %    Platelets 227 140 - 400 x10 3/uL    RBC 4.61 4.20 - 5.40 x10 6/uL    MCV 92.2 80.0 - 100.0 fL    MCH 31.7 28.0 - 32.0 pg    MCHC 34.4 32.0 - 36.0 g/dL    RDW 15 12 - 15 %    MPV 8.8 (L) 9.4 - 12.3 fL    Neutrophils 59 None %    Lymphocytes Automated 30 None %    Monocytes 10 None %    Eosinophils Automated 1 None %    Basophils Automated 0 None %  Immature Granulocyte 0 None %    Nucleated RBC 0 0 - 1 /100 WBC     Neutrophils Absolute 3.80 1.80 - 8.10 x10 3/uL    Abs Lymph Automated 1.91 0.50 - 4.40 x10 3/uL    Abs Mono Automated 0.62 0.00 - 1.20 x10 3/uL    Abs Eos Automated 0.05 0.00 - 0.70 x10 3/uL    Absolute Baso Automated 0.01 0.00 - 0.20 x10 3/uL    Absolute Immature Granulocyte 0.01 0 x10 3/uL   Comprehensive metabolic panel    Collection Time: 05/25/14  4:40 PM   Result Value Ref Range    Glucose 96 70 - 100 mg/dL    BUN 30.8 7.0 - 65.7 mg/dL    Creatinine 0.8 0.6 - 1.0 mg/dL    Sodium 846 962 - 952 mEq/L    Potassium 3.6 3.5 - 5.1 mEq/L    Chloride 102 100 - 111 mEq/L    CO2 26 22 - 29 mEq/L    CALCIUM 9.3 8.5 - 10.5 mg/dL    Protein, Total 7.0 6.0 - 8.3 g/dL    Albumin 3.7 3.5 - 5.0 g/dL    AST (SGOT) 21 5 - 34 U/L    ALT 22 0 - 55 U/L    Alkaline Phosphatase 98 37 - 106 U/L    Bilirubin, Total 0.4 0.2 - 1.2 mg/dL    Globulin 3.3 2.0 - 3.6 g/dL    Albumin/Globulin Ratio 1.1 0.9 - 2.2    Anion Gap 10.0 5.0 - 15.0   Troponin I    Collection Time: 05/25/14  4:40 PM   Result Value Ref Range    Troponin I <0.01 0.00 - 0.09 ng/mL   Hemolysis index    Collection Time: 05/25/14  4:40 PM   Result Value Ref Range    Hemolysis Index 11 0 - 18   GFR    Collection Time: 05/25/14  4:40 PM   Result Value Ref Range    EGFR >60.0        MICROBIOLOGY:  Blood Culture:No  Urine Culture: No  Antibiotics Started: No    IMAGING:  Upon my review: CXR    CARDIAC:  EKG Interpretation (upon my review):      Markers:    Recent Labs  Lab 05/25/14  1640   TROPONIN I <0.01       EMERGENCY DEPARTMENT COURSE:  Orders Placed This Encounter   Procedures   . XR Chest  AP Portable   . CBC with differential   . Comprehensive metabolic panel   . Troponin I   . Hemolysis index   . GFR   . Troponin I   . Diet cardiac   . Cardiac monitoring (ED Only)   . ED Holding Orders Expire in 24 Hours   . Notify Admitting Attending ( Change in Condition)   . Notify Physician (Vital Signs)   . Notify Physician (Lab Results)   . Vital Signs Q4HR   . Telemetry 24 Hour  Protocol   . May be transported off monitor   . Full code   . ED Unit Sec Comm Order   . ED Unit Sec Comm Order   . ECG 12 Lead   . Saline lock IV   . Place for Observation Services   . [facility] ED Bed Request       ASSESSMENT & PLAN     MEMORI SAMMON is a 54 y.o. female admitted under [facility] with <principal  problem not specified>.    Patient Active Hospital Problem List:   Chest pain, rule out acute myocardial infarction (05/25/2014)    Assessment: Chest pain with left arm radiation, Troponin negative.admitted for observation, and  R/O CAD    Plan: Serial Troponin, cardiology consulted in ER for stress test.     Hypertensive disorder (05/25/2014)    Assessment: stable BP    Plan: Continue home meds.   Depression (05/25/2014)    Assessment: Mood and affect approproiate    Plan: Resume home meds   Low back pain (05/25/2014)    Assessment: stable    Plan: Resume home meds   Hyperlipidemia (05/25/2014)    Assessment: Hyperlipidemia    Plan: Continue Crestor        GI Prophylaxis  Not  indicated    Nutrition   Cardiac    DVT/VTE Prophylaxis   Lovenox    Anticipated medical stability for discharge: TBD    Service status/Reason for ongoing hospitalization: Chest pain.  Anticipated Discharge Needs:     Artelia Laroche    05/25/2014 7:46 PM  Time Elapsed:45 minutes

## 2014-05-26 LAB — TROPONIN I: Troponin I: <0.01 ng/mL (ref 0.00–0.09)

## 2014-05-26 MED ORDER — PANTOPRAZOLE SODIUM 40 MG PO TBEC
40.0000 mg | DELAYED_RELEASE_TABLET | Freq: Every day | ORAL | Status: AC
Start: 2014-05-26 — End: ?

## 2014-05-26 NOTE — Consults (Addendum)
[facility] CARDIOLOGY CONSULTATION REPORT  [facility]    Date Time: 05/26/2014 8:58 AM  Patient Name: Alicia Waters  Requesting Physician: Deliah Goody, MD       Reason for Consultation:   Chest pain.     History:   Alicia Waters is a 54 y.o. female admitted on 05/25/2014.  We have been asked by Deliah Goody, MD,  to provide cardiac consultation, regarding chest pain.  She has a long history (since 2004) of GERD.  She has prior endoscopy.  She has been on rx (none recently).  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  She was sent to [facility] for bioprosthetic MVR.  Cath before [employer]) showed no CAD.      Past Medical History:     Past Medical History   Diagnosis Date   . Hypertensive disorder    . Thyroid nodule    . Depression    . Mitral valve disorder    . Rheumatic fever    . Chronic back pain 02/11/2013       Past Surgical History:     Past Surgical History   Procedure Laterality Date   . Sinus surgery     . Throat biiopsy     . Cesarean section     . Cervical fusion  01/2012   . Mitral valve replacement         Family History:   History reviewed. No pertinent family history.    Social History:     History     Social History   . Marital Status: Married     Spouse Name: N/A     Number of Children: N/A   . Years of Education: N/A     Social History Main Topics   . Smoking status: Current Every Deane Melick Smoker -- 1.00 packs/Joscelynn Brutus for 20 years     Types: Cigarettes   . Smokeless tobacco: Never Used   . Alcohol Use: No   . Drug Use: No   . Sexual Activity: Not on file     Other Topics Concern   . Not on file     Social History Narrative       Allergies:   No Known Allergies    Medications:     Prescriptions prior to admission   Medication Sig   . amLODIPine (NORVASC) 5 MG tablet Take 5 mg by mouth daily.   Marland Kitchen aspirin 325 MG EC tablet Take 325 mg by mouth every 6 (six) hours as needed for Pain.   Marland Kitchen aspirin EC 81 MG EC tablet Take 1 tablet (81  mg total) by mouth daily.   Marland Kitchen atorvastatin (LIPITOR) 40 MG tablet Take 1 tablet (40 mg total) by mouth nightly.   . baclofen (LIORESAL) 10 MG tablet Take 10 mg by mouth 3 (three) times daily.   . Black Cohosh 540 MG Cap Take by mouth 2 (two) times daily.   . DULoxetine (CYMBALTA) 30 MG capsule Take 30 mg by mouth daily.   . eszopiclone (LUNESTA) 1 MG tablet Take 1 mg by mouth nightly. Take immediately before bedtime   . fexofenadine (ALLEGRA) 30 MG tablet Take 30 mg by mouth daily.   . Flaxseed, Linseed, (FLAX SEEDS PO) Take by mouth.   Marland Kitchen HYDROmorphone (DILAUDID) 4 MG tablet Take 4 mg by mouth every 4 (four) hours as needed for Pain.   Marland Kitchen ipratropium (ATROVENT) 0.03 %  nasal spray 2 sprays by Nasal route every 12 (twelve) hours.   . lidocaine (LIDODERM) 5 % Place 1 patch onto the skin daily. Remove & Discard patch within 12 hours or as directed by MD   . losartan-hydrochlorothiazide (HYZAAR) 100-25 MG per tablet Take 1 tablet by mouth daily.   . Multiple Vitamin (MULTIVITAMIN) capsule Take 1 capsule by mouth daily.   . rosuvastatin (CRESTOR) 5 MG tablet Take 5 mg by mouth daily.   . Tapentadol HCl (NUCYNTA ER) 150 MG Tablet SR 12 hr Take by mouth 2 (two) times daily.   . traZODone (DESYREL) 50 MG tablet Take 50 mg by mouth nightly.        Current Facility-Administered Medications   Medication Dose Route Frequency   . amLODIPine  5 mg Oral Daily   . aspirin EC  81 mg Oral Daily   . baclofen  10 mg Oral TID   . DULoxetine  30 mg Oral Q24H   . enoxaparin  40 mg Subcutaneous Daily   . influenza  0.5 mL Intramuscular Once   . lidocaine  1 patch Transdermal Q24H   . lisinopril-hydrochlorothiazide 5-12.5 combo dose   Oral Daily   . rosuvastatin  5 mg Oral Q24H   . traZODone  50 mg Oral QHS         Review of Systems:    Comprehensive review of systems including constitutional, eyes, ears, nose, mouth, throat, cardiovascular, GI, GU, musculoskeletal, integumentary, respiratory, neurologic, psychiatric, and endocrine is  negative other than what is mentioned already in the history of present illness    Physical Exam:     VITAL SIGNS PHYSICAL EXAM   Filed Vitals:    05/26/14 0748   BP: 116/66   Pulse: 77   Temp: 96.4 F (35.8 C)   Resp: 18   SpO2: 98%     Temp (24hrs), Avg:97.3 F (36.3 C), Min:96.3 F (35.7 C), Max:99.2 F (37.3 C)      Intake and Output Summary (Last 24 hours) at Date Time  No intake or output data in the 24 hours ending 05/26/14 0858    Telemetry: no changes Physical Exam  General: awake, alert, breathing comfortably, no acute distress  Head: normocephalic  Eyes: EOM's intact  Cardiovascular: regular rate and rhythm, normal S1, S2, no S3, no S4, no murmurs, rubs or gallops  Neck: no carotid bruits or JVD  Lungs: clear to auscultation bilateraly, without wheezing, rhonchi, or rales  Abdomen: soft, non-tender, non-distended; no palpable masses,  normoactive bowel sounds  Extremities: no edema  Pulse: equal pulses, 4/4 symmetric  Neurological: Alert and oriented X3, mood and affect normal  Musculoskeletal: normal strength and tone     Labs Reviewed:     Recent Labs  Lab 05/26/14  0653 05/25/14  2231 05/25/14  1640   TROPONIN I <0.01 0.01 <0.01               Recent Labs  Lab 05/25/14  1640   BILIRUBIN, TOTAL 0.4   PROTEIN, TOTAL 7.0   ALBUMIN 3.7   ALT 22   AST (SGOT) 21               Recent Labs  Lab 05/25/14  1640   WBC 6.39   HEMOGLOBIN 14.6   HEMATOCRIT 42.5   PLATELETS 227       Recent Labs  Lab 05/25/14  1640   SODIUM 138   POTASSIUM 3.6   CHLORIDE 102   CO2  26   BUN 10.0   CREATININE 0.8   EGFR >60.0   GLUCOSE 96   CALCIUM 9.3       DIAGNOSTIC    EKG:     chest X-ray    Assessment:    Chest pain.  Negative troponins.  Cath before MV surgery last year without significant CAD.   MV disease.  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  MS 1.4 cm2.  Mod-severe MR.  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     H/o GERD.  Prior endoscopy.     Recommendations:    Reviewed with hospitalist.   Low risk for discharge from cardiac standpoint  (negative troponins and reassuring cath a year ago).   Would add rx for GERD (PPI).   Has f/u at [facility] tomorrow.   Will sign off.   Call if further issues or questions.             Signed by: Bertram Millard Margee Trentham, MD    [facility]  [phone]

## 2014-05-26 NOTE — Discharge Summary (Signed)
[facility] DISCHARGE INSTRUCTIONS     Date of Admission: 05/25/2014    Date of Discharge: 05/26/2014      Consultants: dr day cardiology  Pending Studies: n/a     Further Instructions: educated on GERD     Discharge Diet:Cardiac    Surgery/Procedure: none    Admission Diagnosis: Chest pain, rule out acute myocardial infarction [R07.9]    Problem List  Patient Active Problem List   Diagnosis   . HTN (hypertension)   . Chest pain   . Hypotension   . Chronic back pain   . Chest pain, rule out acute myocardial infarction   . Hypertensive disorder   . Depression   . Low back pain   . Hyperlipidemia         HOSPITAL COURSE:    Admitted for chest pain.  Patient's troponins are negative.  Today she is feeling okay.  Patient seen by cardiologist.  Cardiology stated that patient had a cardiac catheterization about a year ago.  Cardiology did not think that this is cardiac origin and was suspecting source of pain from gastroesophageal reflux disease.  Recommended PPI and discharge home.  I I explained patient what gastroesophageal reflux disease is.  Recommended Protonix.  I educated her about lifestyle changes and diet habits.     Discharge Medications     Medication List      START taking these medications          pantoprazole 40 MG tablet   Commonly known as:  PROTONIX   Take 1 tablet (40 mg total) by mouth daily. 30 min before breakfast         CHANGE how you take these medications          aspirin 325 MG EC tablet   What changed:  Another medication with the same name was removed. Continue taking this medication, and follow the directions you see here.       atorvastatin 40 MG tablet   Commonly known as:  LIPITOR   Take 1 tablet (40 mg total) by mouth nightly.   What changed:  how much to take         CONTINUE taking these medications          amLODIPine 5 MG tablet   Commonly known as:  NORVASC       baclofen 10 MG tablet   Commonly known as:  LIORESAL       DULoxetine 30 MG capsule   Commonly known as:  CYMBALTA        eszopiclone 1 MG tablet   Commonly known as:  LUNESTA       fexofenadine 30 MG tablet   Commonly known as:  ALLEGRA       HYDROmorphone 4 MG tablet   Commonly known as:  DILAUDID       ipratropium 0.03 % nasal spray   Commonly known as:  ATROVENT       lidocaine 5 %   Commonly known as:  LIDODERM       losartan-hydrochlorothiazide 100-25 MG per tablet   Commonly known as:  HYZAAR       multivitamin capsule       NUCYNTA ER 150 MG Tb12   Generic drug:  Tapentadol HCl       rosuvastatin 5 MG tablet   Commonly known as:  CRESTOR       traZODone 50 MG tablet   Commonly known as:  DESYREL  STOP taking these medications          Black Cohosh 540 MG Caps       FLAX SEEDS PO         Where to Get Your Medications     These are the prescriptions that you need to pick up.         You may get the following medications from any pharmacy   -  pantoprazole 40 MG tablet                      Follow up Appointments        Follow-up Information     Schedule an appointment as soon as possible for a visit with Ryder, Kristine Linea., MD.    Specialty:  Family Medicine    Contact information:    [address]  [place] [address]  [phone]        pt also to follow with cardiology at [facility] tomorrow

## 2014-05-26 NOTE — Progress Notes (Signed)
Patient is admitted from home for chest pain. Pain subside after sublingual nitro given at the ED.   Trop x 1 has been negative. Will follow up the serial cardiac enzymes   SR on the monitor. VSS.  Denies any pain or discomfort.   Patient is NPO for possible stress test in AM.  Patient is oriented to the unit. All fall precautions in place. Patient is encouraged to call for assistance

## 2014-05-26 NOTE — Plan of Care (Signed)
Problem: Health Promotion  Goal: Knowledge - health resources  Extent of understanding and conveyed about healthcare resources.   Outcome: Progressing  Patient is admitted from home for chest pain. Pain subside after sublingual nitro given at the ED.   Trop x 2 has been negative.   SR on the monitor. VSS.  Denies any pain or discomfort.   Patient is NPO for possible stress test in AM.  Patient is oriented to the unit. All fall precautions in place. Patient is encouraged to call for assistance     Problem: Safety  Goal: Patient will be free from injury during hospitalization  Outcome: Progressing  All fall precautions in place. Bed alarm on use. Patient is encouraged to call for assistance.    Problem: Pain  Goal: Patient's pain/discomfort is manageable  Outcome: Progressing  Denies any pain.

## 2014-05-26 NOTE — Plan of Care (Signed)
Patient seen & cleared for discharge to home by Cardiology & attending Dr Lanny Hurst & will need to follow up as outpatient with PMD.Patient verbalized understanding of discharge instruction & follow up appt.

## 2014-05-26 NOTE — Plan of Care (Signed)
Problem: Hemodynamic Status: Cardiac  Goal: Stable vital signs and fluid balance  Outcome: Progressing  The patient and  Husband Alicia Waters  learning abilities have been assessed. Today's individualized plan of care to . Assess signs and symptoms associated with cardiac rhythm changes, discharge planning already cleared by Cardiology was discussed with the patient and  Husband Alicia Waters  and agree to it. Patient and husband Alicia Waters  demonstrates understanding of disease process, treatment plan, medications and consequences of noncompliance. All questions and concerns were addressed.  Patient denies any chest pain or sob ,but complains of abdominal discomfort, seen & cleared by Dr Day Cardiologist & husband Alicia Waters at bedside was  able to  Talk to attending Dr Lanny Hurst about plans/goals for discharge.

## 2014-05-26 NOTE — Discharge Instructions (Addendum)
Noncardiac Chest Pain    Based on your visit today, the health care provider doesn't know what is causing your chest pain. In most cases, people who come to the emergency department with chest pain don't have a problem with their heart. Instead, the pain is caused by other conditions. These may be problems with the lungs, muscles, bones, digestive tract, nerves, or mental health.  Lung problems   Inflammation around the lungs (pleurisy)   Collapsed lung (pneumothorax)   Fluid around the lungs (pleural effusion)   Lung cancer. This is a rare cause of chest pain.  Muscle or bone problems   Inflamed cartilage between the ribs (pleurisy)   Fibromyalgia   Rheumatoid arthritis  Digestive system problems   Reflux   Stomach ulcer   Spasms of the esophagus   Gall stones   Gallbladder inflammation  Mental health conditions   Panic or anxiety attacks   Emotional distress  Your condition doesn't seem serious and your pain doesn't appear to be coming from your heart. But sometimes the signs of a serious problem take more time to appear. Watch for the warning signs listed below.  Home care  Follow these guidelines when caring for yourself at home:   Rest today and avoid strenuous activity.   Take any prescribed medicine as directed.  Follow-up care  Follow up with your health care provider, or as advised, if you don't start to feel better within 24 hours.  When to seek medical advice  Call your health care provider right away if any of these occur:   A change in the type of pain. Call if it feels different, becomes more serious, lasts longer, or begins to spread into your shoulder, arm, neck, jaw, or back.   Shortness of breath   You feel more pain when you breathe   Cough with dark-colored mucus or blood   Weakness, dizziness, or fainting   Fever of 100.12F (38C) or higher, or as directed by your health care provider   Swelling, pain, or redness in one leg     2000-2015 [employer] [address] [place], [place] [address]. All rights reserved. This information is not intended as a substitute for professional medical care. Always follow your healthcare professional's instructions.      Pantoprazole Sodium Gastro-resistant tablet  What is this medicine?  PANTOPRAZOLE (pan TOE pra zole) prevents the production of acid in the stomach. It is used to treat gastroesophageal reflux disease (GERD), inflammation of the esophagus, and Zollinger-Ellison syndrome.  This medicine may be used for other purposes; ask your health care provider or pharmacist if you have questions.  What should I tell my health care provider before I take this medicine?  They need to know if you have any of these conditions:   liver disease   low levels of magnesium in the blood   an unusual or allergic reaction to omeprazole, lansoprazole, pantoprazole, rabeprazole, other medicines, foods, dyes, or preservatives   pregnant or trying to get pregnant   breast-feeding  How should I use this medicine?  Take this medicine by mouth. Swallow the tablets whole with a drink of water. Follow the directions on the prescription label. Do not crush, break, or chew. Take your medicine at regular intervals. Do not take your medicine more often than directed.  Talk to your pediatrician regarding the use of this medicine in children. While this drug may be prescribed for children as young as 5 years for  selected conditions, precautions do apply.  Overdosage: If you think you have taken too much of this medicine contact a poison control center or emergency room at once.  NOTE: This medicine is only for you. Do not share this medicine with others.  What if I miss a dose?  If you miss a dose, take it as soon as you can. If it is almost time for your next dose, take only that dose. Do not take double or extra doses.  What may interact with this medicine?  Do not take this medicine with any of the following  medications:   atazanavir   nelfinavir  This medicine may also interact with the following medications:   ampicillin   delavirdine   digoxin   diuretics   iron salts   medicines for fungal infections like ketoconazole, itraconazole and voriconazole   warfarin  This list may not describe all possible interactions. Give your health care provider a list of all the medicines, herbs, non-prescription drugs, or dietary supplements you use. Also tell them if you smoke, drink alcohol, or use illegal drugs. Some items may interact with your medicine.  What should I watch for while using this medicine?  It can take several days before your stomach pain gets better. Check with your doctor or health care professional if your condition does not start to get better, or if it gets worse.  You may need blood work done while you are taking this medicine.  What side effects may I notice from receiving this medicine?  Side effects that you should report to your doctor or health care professional as soon as possible:   allergic reactions like skin rash, itching or hives, swelling of the face, lips, or tongue   bone, muscle or joint pain   breathing problems   chest pain or chest tightness   dark yellow or brown urine   dizziness   fast, irregular heartbeat   feeling faint or lightheaded   fever or sore throat   muscle spasm   palpitations   redness, blistering, peeling or loosening of the skin, including inside the mouth   seizures   tremors   unusual bleeding or bruising   unusually weak or tired   yellowing of the eyes or skin  Side effects that usually do not require medical attention (Report these to your doctor or health care professional if they continue or are bothersome.):   constipation   diarrhea   dry mouth   headache   nausea  This list may not describe all possible side effects. Call your doctor for medical advice about side effects. You may report side effects to FDA at  1-800-FDA-1088.  Where should I keep my medicine?  Keep out of the reach of children.  Store at room temperature between 15 and 30 degrees C (59 and 86 degrees F). Protect from light and moisture. Throw away any unused medicine after the expiration date.  NOTE:This sheet is a summary. It may not cover all possible information. If you have questions about this medicine, talk to your doctor, pharmacist, or health care provider. Copyright 2015 [employer]

## 2014-05-26 NOTE — Progress Notes (Signed)
Patient seen & cleared for discharge to home by Cardiology Dr Day & attending  Dr. Lanny Hurst  & will need to follow up with PMD as outpatient .Patient  & husband  Zollie Beckers verbalized understanding of discharge instruction & follow up appt.As per husband  Already had an appt on tomorrow @ [facility].Saline lock & tele discontinue.Copy of med rec & discharge instruction & priscription given to patient & husband Zollie Beckers.Patient left the unit with stable vital signs via wheelchair accompanied by husband Zollie Beckers & personal belongings.

## 2014-05-27 LAB — ECG 12-LEAD
Atrial Rate: 80 {beats}/min
P Axis: 73 degrees
P-R Interval: 148 ms
Q-T Interval: 442 ms
QRS Duration: 80 ms
QTC Calculation (Bezet): 509 ms
R Axis: 59 degrees
T Axis: 65 degrees
Ventricular Rate: 80 {beats}/min

## 2014-12-09 ENCOUNTER — Observation Stay: Payer: Enrolled Prime—HMO

## 2014-12-09 ENCOUNTER — Observation Stay
Admission: EM | Admit: 2014-12-09 | Discharge: 2014-12-09 | Disposition: A | Discharge status: Home / Self Care | Payer: Enrolled Prime—HMO | Attending: Emergency Medicine | Admitting: Emergency Medicine

## 2014-12-09 ENCOUNTER — Emergency Department: Payer: Enrolled Prime—HMO

## 2014-12-09 DIAGNOSIS — Z87891 Personal history of nicotine dependence: Secondary | ICD-10-CM | POA: Insufficient documentation

## 2014-12-09 DIAGNOSIS — R079 Chest pain, unspecified: Principal | ICD-10-CM | POA: Insufficient documentation

## 2014-12-09 DIAGNOSIS — M549 Dorsalgia, unspecified: Secondary | ICD-10-CM | POA: Insufficient documentation

## 2014-12-09 DIAGNOSIS — Z7982 Long term (current) use of aspirin: Secondary | ICD-10-CM | POA: Insufficient documentation

## 2014-12-09 DIAGNOSIS — R61 Generalized hyperhidrosis: Secondary | ICD-10-CM | POA: Insufficient documentation

## 2014-12-09 DIAGNOSIS — I10 Essential (primary) hypertension: Secondary | ICD-10-CM | POA: Insufficient documentation

## 2014-12-09 DIAGNOSIS — R0602 Shortness of breath: Secondary | ICD-10-CM | POA: Insufficient documentation

## 2014-12-09 DIAGNOSIS — Z952 Presence of prosthetic heart valve: Secondary | ICD-10-CM | POA: Insufficient documentation

## 2014-12-09 DIAGNOSIS — G8929 Other chronic pain: Secondary | ICD-10-CM | POA: Insufficient documentation

## 2014-12-09 DIAGNOSIS — R42 Dizziness and giddiness: Secondary | ICD-10-CM | POA: Insufficient documentation

## 2014-12-09 LAB — COMPREHENSIVE METABOLIC PANEL
ALT: 26 U/L (ref 0–55)
AST (SGOT): 19 U/L (ref 5–34)
Albumin/Globulin Ratio: 1.1 (ref 0.9–2.2)
Albumin: 3.8 g/dL (ref 3.5–5.0)
Alkaline Phosphatase: 77 U/L (ref 37–106)
Anion Gap: 15 (ref 5.0–15.0)
BUN: 11 mg/dL (ref 7.0–19.0)
Bilirubin, Total: 0.5 mg/dL (ref 0.2–1.2)
CO2: 25 mEq/L (ref 22–29)
Calcium: 9.8 mg/dL (ref 8.5–10.5)
Chloride: 101 mEq/L (ref 100–111)
Creatinine: 1 mg/dL (ref 0.6–1.0)
Globulin: 3.4 g/dL (ref 2.0–3.6)
Glucose: 129 mg/dL — ABNORMAL HIGH (ref 70–100)
Potassium: 3.5 mEq/L (ref 3.5–5.1)
Protein, Total: 7.2 g/dL (ref 6.0–8.3)
Sodium: 141 mEq/L (ref 136–145)

## 2014-12-09 LAB — CBC AND DIFFERENTIAL
Basophils Absolute Automated: 0.01 10*3/uL (ref 0.00–0.20)
Basophils Automated: 0 %
Eosinophils Absolute Automated: 0.05 10*3/uL (ref 0.00–0.70)
Eosinophils Automated: 1 %
Hematocrit: 42.3 % (ref 37.0–47.0)
Hgb: 14.8 g/dL (ref 12.0–16.0)
Immature Granulocytes Absolute: 0.01 10*3/uL
Immature Granulocytes: 0 %
Lymphocytes Absolute Automated: 1.92 10*3/uL (ref 0.50–4.40)
Lymphocytes Automated: 33 %
MCH: 33 pg — ABNORMAL HIGH (ref 28.0–32.0)
MCHC: 35 g/dL (ref 32.0–36.0)
MCV: 94.2 fL (ref 80.0–100.0)
MPV: 9.1 fL — ABNORMAL LOW (ref 9.4–12.3)
Monocytes Absolute Automated: 0.41 10*3/uL (ref 0.00–1.20)
Monocytes: 7 %
Neutrophils Absolute: 3.4 10*3/uL (ref 1.80–8.10)
Neutrophils: 59 %
Nucleated RBC: 0 /100 WBC (ref 0–1)
Platelets: 207 10*3/uL (ref 140–400)
RBC: 4.49 10*6/uL (ref 4.20–5.40)
RDW: 13 % (ref 12–15)
WBC: 5.79 10*3/uL (ref 3.50–10.80)

## 2014-12-09 LAB — HEMOLYSIS INDEX: Hemolysis Index: 12 (ref 0–18)

## 2014-12-09 LAB — TROPONIN I: Troponin I: <0.01 ng/mL (ref 0.00–0.09)

## 2014-12-09 LAB — GFR: EGFR: >60

## 2014-12-09 MED ORDER — MORPHINE SULFATE 2 MG/ML IJ/IV SOLN (WRAP)
2.0000 mg | Freq: Once | Status: AC
Start: 2014-12-09 — End: 2014-12-09
  Administered 2014-12-09: 2 mg via INTRAVENOUS
  Filled 2014-12-09: qty 1

## 2014-12-09 MED ORDER — SODIUM CHLORIDE 0.9 % IV BOLUS
1000.0000 mL | Freq: Once | INTRAVENOUS | Status: AC
Start: 2014-12-09 — End: 2014-12-09
  Administered 2014-12-09: 1000 mL via INTRAVENOUS

## 2014-12-09 NOTE — ED Provider Notes (Signed)
EMERGENCY DEPARTMENT HISTORY AND PHYSICAL EXAM    Date: 12/09/2014  Patient Name: Alicia Waters  Attending Physician:  Kelly Splinter, MD  Diagnosis and Treatment Plan       Clinical Impression:   1. Chest pain in adult    2. SOB (shortness of breath)        Treatment Plan:   ED Disposition     Observation Admitting Physician: Elna Breslow [[id number]]  Diagnosis: Chest pain [[id number]]  Estimated Length of Stay: < 2 midnights  Tentative Discharge Plan?: Home or Self Care [1]  Patient Class: Observation [104]            History of Presenting Illness     Chief Complaint   Patient presents with   . Shortness of Breath   . Chest Pain       History Provided By: Patient   Chief Complaint: Chest pain    Additional History: Alicia Waters is a 54 y.o. female presenting to the ED with chest pain which began 1-2 hours PTA while cleaning her kitchen. She describes the pain as "squeezing." Pt currently rates the pain at a 5/10. Prior to chest pain, she noted diaphoresis, SOB and nausea. She also c/o lightheadedness. Pt has never had these sx before. She is unsure of new inhalants as she reports using a different cleaning product for cleaning but reports using a mask. Pt felt at baseline, prior to onset of pain. Pt is on a daily aspirin (325 mg), which she took today. 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 She denies CAD or an irregular rhythm. Pt reports following with a pain management doctor for DJD and a cervical fusion. She denies cough, vomiting, changes in back pain, diarrhea, numbness or weakness. Pt is not pregnant. Pt has NKDA.    Cardiologist: Dr. Valarie Cones ([facility])  PCP: Carney Living., MD    No current facility-administered medications for this encounter.    Current outpatient prescriptions:   .  amLODIPine (NORVASC) 5 MG tablet, Take 5 mg by mouth daily., Disp: , Rfl:   .  aspirin 325 MG EC tablet, Take 325 mg by mouth daily.  , Disp: , Rfl:   .  baclofen  (LIORESAL) 10 MG tablet, Take 10 mg by mouth 3 (three) times daily., Disp: , Rfl:   .  DULoxetine (CYMBALTA) 30 MG capsule, Take 30 mg by mouth daily., Disp: , Rfl:   .  DULoxetine (CYMBALTA) 30 MG capsule, Take 30 mg by mouth daily., Disp: , Rfl:   .  fexofenadine (ALLEGRA) 30 MG tablet, Take 30 mg by mouth daily., Disp: , Rfl:   .  ipratropium (ATROVENT) 0.03 % nasal spray, 2 sprays by Nasal route every 12 (twelve) hours., Disp: , Rfl:   .  lidocaine (LIDODERM) 5 %, Place 1 patch onto the skin daily. Remove & Discard patch within 12 hours or as directed by MD, Disp: , Rfl:   .  losartan-hydrochlorothiazide (HYZAAR) 100-25 MG per tablet, Take 1 tablet by mouth daily., Disp: , Rfl:   .  Multiple Vitamin (MULTIVITAMIN) capsule, Take 1 capsule by mouth daily., Disp: , Rfl:   .  rosuvastatin (CRESTOR) 5 MG tablet, Take 5 mg by mouth daily., Disp: , Rfl:   .  eszopiclone (LUNESTA) 1 MG tablet, Take 1 mg by mouth nightly as needed. Take immediately before bedtime , Disp: , Rfl:   .  pantoprazole (PROTONIX) 40 MG tablet,  Take 1 tablet (40 mg total) by mouth daily. 30 min before breakfast, Disp: 30 tablet, Rfl: 0  .  traZODone (DESYREL) 50 MG tablet, Take 50 mg by mouth nightly., Disp: , Rfl:     Past Medical History     Past Medical History   Diagnosis Date   . Hypertensive disorder    . Thyroid nodule    . Depression    . Mitral valve disorder    . Rheumatic fever    . Chronic back pain 02/11/2013     Past Surgical History   Procedure Laterality Date   . Sinus surgery     . Throat biiopsy     . Cesarean section     . Cervical fusion  01/2012   . Mitral valve replacement         Family History     Family History   Problem Relation Age of Onset   . Heart disease Maternal Grandmother    . Heart attack Maternal Uncle    . Heart attack Paternal Uncle    . Hypertension Sister      2 sisters, grandmother       Social History     Social History     Social History   . Marital Status: Married     Spouse Name: N/A   . Number of  Children: N/A   . Years of Education: N/A     Social History Main Topics   . Smoking status: Former Smoker -- 1.00 packs/day for 20 years     Types: Cigarettes   . Smokeless tobacco: Never Used      Comment: Quit 2 months ago   . Alcohol Use: No   . Drug Use: No   . Sexual Activity: Not on file     Other Topics Concern   . Not on file     Social History Narrative    Two children, married. Not currently working.       Allergies     No Known Allergies    Review of Systems     Review of Systems   Constitutional: Positive for diaphoresis.   Respiratory: Positive for shortness of breath. Negative for cough.    Cardiovascular: Positive for chest pain.   Gastrointestinal: Positive for nausea. Negative for vomiting and diarrhea.   Musculoskeletal: Negative for back pain.   Allergic/Immunologic:        + NKDA   Neurological: Positive for light-headedness. Negative for weakness and numbness.   Psychiatric/Behavioral: Negative for suicidal ideas.     Patient asked and denies other acute complaints or concerns.    Physical Exam     BP 118/66 mmHg  Pulse 78  Temp(Src) 98 F (36.7 C) (Oral)  Resp 14  SpO2 99%  Pulse Oximetry Analysis - Normal at 98% On RA.    Physical Exam   Constitutional: She is oriented to person, place, and time. She appears well-developed and well-nourished.   HENT:   Head: Normocephalic and atraumatic.   mmm   Eyes: Conjunctivae are normal. No scleral icterus.   Neck: Normal range of motion. Neck supple.   Cardiovascular: Normal rate, regular rhythm and intact distal pulses.    Murmur heard.  Soft murmur   Pulmonary/Chest: Effort normal. No respiratory distress. She has no wheezes. She has no rales. She exhibits no tenderness.   Well healed sternal scar   Abdominal: Soft. She exhibits no distension. There is no tenderness.   Musculoskeletal: Normal  range of motion. She exhibits no edema or tenderness.   Neurological: She is alert and oriented to person, place, and time. She exhibits normal muscle tone.  Coordination normal.   Skin: Skin is warm. She is diaphoretic.   Psychiatric: She has a normal mood and affect. Her behavior is normal. Judgment and thought content normal.   Nursing note and vitals reviewed.        Diagnostic Study Results     Labs -     Results     Procedure Component Value Units Date/Time    Troponin I [[id number]] Collected:  12/09/14 1830    Specimen Information:  Blood Updated:  12/09/14 1904     Troponin I <0.01 ng/mL     Comprehensive metabolic panel [[id number]]  (Abnormal) Collected:  12/09/14 1830    Specimen Information:  Blood Updated:  12/09/14 1858     Glucose 129 (H) mg/dL      BUN 78.2 mg/dL      Creatinine 1.0 mg/dL      Sodium 956 mEq/L      Potassium 3.5 mEq/L      Chloride 101 mEq/L      CO2 25 mEq/L      Calcium 9.8 mg/dL      Protein, Total 7.2 g/dL      Albumin 3.8 g/dL      AST (SGOT) 19 U/L      ALT 26 U/L      Alkaline Phosphatase 77 U/L      Bilirubin, Total 0.5 mg/dL      Globulin 3.4 g/dL      Albumin/Globulin Ratio 1.1      Anion Gap 15.0     Hemolysis index [[id number]] Collected:  12/09/14 1830     Hemolysis Index 12 Updated:  12/09/14 1858    GFR [[id number]] Collected:  12/09/14 1830     EGFR >60.0 Updated:  12/09/14 1858    CBC with differential [[id number]]  (Abnormal) Collected:  12/09/14 1830    Specimen Information:  Blood from Blood Updated:  12/09/14 1841     WBC 5.79 x10 3/uL      Hgb 14.8 g/dL      Hematocrit 27.2 %      Platelets 207 x10 3/uL      RBC 4.49 x10 6/uL      MCV 94.2 fL      MCH 33.0 (H) pg      MCHC 35.0 g/dL      RDW 13 %      MPV 9.1 (L) fL      Neutrophils 59 %      Lymphocytes Automated 33 %      Monocytes 7 %      Eosinophils Automated 1 %      Basophils Automated 0 %      Immature Granulocyte 0 %      Nucleated RBC 0 /100 WBC      Neutrophils Absolute 3.40 x10 3/uL      Abs Lymph Automated 1.92 x10 3/uL      Abs Mono Automated 0.41 x10 3/uL      Abs Eos Automated 0.05 x10 3/uL      Absolute Baso Automated 0.01 x10 3/uL      Absolute Immature  Granulocyte 0.01 x10 3/uL           Radiologic Studies -   Radiology Results (24 Hour)     Procedure Component Value Units Date/Time    XR  Chest  AP Portable [[id number]] Collected:  12/09/14 1837    Order Status:  Completed Updated:  12/09/14 1842    Narrative:      HISTORY::Chest pain and shortness of breath.    TECHNIQUE: An AP portable chest performed at 1822 hours    FINDINGS: The lungs are hypoexpanded but clear of acute focal  infiltrates.  The heart, mediastinum and bony thorax are unremarkable  for age and AP technique.      Impression:       No radiographic evidence for acute cardiopulmonary disease.    Lorenda Peck, MD   12/09/2014 6:38 PM        .    Doctor's Notes     Throughout the stay in the Emergency Department, questions and concerns surrounding pain control, care plans, diagnostic studies, effects of medications administered or prescribed, and future prognostic dilemmas were assessed and addressed.    ROS addendum: The patient and/or family was asked if they had any other complaints or concerns that we could address today and nothing of significance was noted.     VITALS:  Patient Vitals for the past 12 hrs:   BP Temp Pulse Resp   12/09/14 2230 118/66 mmHg - 78 14   12/09/14 2200 127/65 mmHg - 74 14   12/09/14 2100 138/77 mmHg - 72 12   12/09/14 2030 144/73 mmHg - 72 12   12/09/14 1959 148/65 mmHg 98 F (36.7 C) 73 14   12/09/14 1931 131/76 mmHg 98.1 F (36.7 C) 81 (!) 24   12/09/14 1811 131/76 mmHg 98.1 F (36.7 C) 81 (!) 24     EKG: sinus, rate 74, possible LAE, prolonged QT, no ectopy/ischemia/infarct    IMP & PLAN: chest pain and sob while exerting herself, h/o chronic pain but no acute complaints related to this, h/o tissue valve repair (she is sure if bovine/porcine)  Plan on ekg and imaging and calcuation of HEART score. Of note patient diaphoretic and tachypnic and clearly uncomfortable. Took asa at home. We will give morphine here.    ED COURSE:   6:11 PM - Discussed plan for CXR,  Morphine, IVF and blood work.     7:28 PM - Pt reports improvement in chest pain and SOB. Updated pt on test results. Troponin is WNL.    7:48 PM - Pt is still feeling better without O2 therapy and second morphine. Given pt has a heart score of 4, recommended admission for chest pain. Pt is in agreement with plan.     8:11 PM - D/c with Dr. Kerrin Mo ([facility]) who accepts pt for hospitalization.     8:33 PM - D/c with Dr. Shawnee Knapp who will consult. She has had evaluation for CAD in the patient and this has been reassuring.    12:04 AM - Per RN, pt has signed out AMA.    I was able to update Dr. Shawnee Knapp.    HEART SCORE  Used for ACS Risk Stratification (Low Risk = total score of 0-3)  History:         Moderately suspicious                                                  +1  ECG:            Normal  Age:  45-65                                                                             +1  Risk:             >=3 risk factors or history of atherosclerotic disease  +2  Troponin:      <= normal limit  Score: 4    _______________________________  Medical DeMedical Decision Makingcision Making  Attestations:     Physician/Midlevel provider first contact with patient: 12/09/14 1805         This note is prepared by Illene Bolus Alemu, acting as Scribe for Kelly Splinter, MD.    Kelly Splinter, MD:  The scribe's documentation has been prepared under my direction and personally reviewed by me in its entirety.  I confirm that the note above accurately reflects all work, treatment, procedures, and medical decision making performed by me.    I am the first provider for this patient.    Kelly Splinter, MD is the primary emergency doctor of record.    I reviewed the vital signs, available nursing notes, past medical history, past surgical history, family history and social history.    _______________________________              Westley Foots, MD  12/10/14 2221

## 2014-12-09 NOTE — ED Notes (Signed)
Pt has moved to [facility] zone since there are boarding.

## 2014-12-09 NOTE — ED Notes (Signed)
Bed: PU34  Expected date:   Expected time:   Means of arrival:   Comments:

## 2014-12-09 NOTE — ED Notes (Signed)
Bed: BL17  Expected date: 12/09/14  Expected time: 5:38 PM  Means of arrival: [id number] [place]  Comments:  [facility]

## 2014-12-09 NOTE — ED Notes (Signed)
Pt expressing wishes to leave AMA. Message sent to [facility] MD via scribe to see Pt.

## 2014-12-09 NOTE — ED Notes (Signed)
Pt and family insisting to leave AMA. RN unable to find MD. Charge nurse consulted. IV removed.

## 2014-12-09 NOTE — ED Notes (Addendum)
BIBA. Pt reports she was "At home scrubbing really hard with 409 cleaner" when she started feeling SOB and nauseous.     Pt has a hx of mitral valve replacement x 1.5 years ago.     Pt denies N/V/D.     Pt is A&O x4. Tachypnic and Diaphoretic.

## 2014-12-09 NOTE — ED Notes (Signed)
Spoke with Dr. Melvyn Neth and she verified that patient is able to eat. Pt notified.

## 2014-12-10 LAB — ECG 12-LEAD
Atrial Rate: 74 {beats}/min
P Axis: 74 degrees
P-R Interval: 156 ms
Q-T Interval: 440 ms
QRS Duration: 86 ms
QTC Calculation (Bezet): 488 ms
R Axis: 50 degrees
T Axis: 57 degrees
Ventricular Rate: 74 {beats}/min

## 2015-01-29 ENCOUNTER — Ambulatory Visit (INDEPENDENT_AMBULATORY_CARE_PROVIDER_SITE_OTHER): Payer: Self-pay

## 2015-04-30 ENCOUNTER — Ambulatory Visit (INDEPENDENT_AMBULATORY_CARE_PROVIDER_SITE_OTHER): Payer: Self-pay

## 2015-08-22 ENCOUNTER — Emergency Department: Payer: Enrolled Prime—HMO

## 2015-08-22 ENCOUNTER — Emergency Department
Admission: EM | Admit: 2015-08-22 | Discharge: 2015-08-22 | Disposition: A | Discharge status: Home / Self Care | Payer: Enrolled Prime—HMO | Attending: Emergency Medicine | Admitting: Emergency Medicine

## 2015-08-22 DIAGNOSIS — M79662 Pain in left lower leg: Secondary | ICD-10-CM | POA: Insufficient documentation

## 2015-08-22 DIAGNOSIS — Z87891 Personal history of nicotine dependence: Secondary | ICD-10-CM | POA: Insufficient documentation

## 2015-08-22 DIAGNOSIS — Z7982 Long term (current) use of aspirin: Secondary | ICD-10-CM | POA: Insufficient documentation

## 2015-08-22 DIAGNOSIS — Z981 Arthrodesis status: Secondary | ICD-10-CM | POA: Insufficient documentation

## 2015-08-22 DIAGNOSIS — I10 Essential (primary) hypertension: Secondary | ICD-10-CM | POA: Insufficient documentation

## 2015-08-22 DIAGNOSIS — Z79899 Other long term (current) drug therapy: Secondary | ICD-10-CM | POA: Insufficient documentation

## 2015-08-22 DIAGNOSIS — Z952 Presence of prosthetic heart valve: Secondary | ICD-10-CM | POA: Insufficient documentation

## 2015-08-22 NOTE — ED Notes (Addendum)
Doppler tech will be available at 6 pm

## 2015-08-22 NOTE — ED Notes (Signed)
Alicia Waters is a 55 y.o. female presenting with L leg pain and swelling since Tuesday. Also reports some SOB.   Blood pressure 134/72, pulse 86, temperature 98.8 F (37.1 C), temperature source Oral, resp. rate 18, height 5\' 3"  (1.6 m), weight 83.915 kg, SpO2 98 %.

## 2015-08-22 NOTE — ED Provider Notes (Signed)
EMERGENCY DEPARTMENT HISTORY AND PHYSICAL EXAM     Physician/Midlevel provider first contact with patient: 08/22/15 1634         Date: 08/22/2015  Patient Name: Alicia Waters    History of Presenting Illness     Chief Complaint   Patient presents with   . Leg Swelling       History Provided By: Patient    Chief Complaint: Leg swelling   Onset: 3 days ago  Timing: Gradual  Location: Left inner knee and calf  Quality: Painful  Severity: Moderate  Exacerbating factors: None  Alleviating factors: None  Associated Symptoms: Leg pain and numbness  Pertinent Negatives: No CP, SOB, n/v, fever, chills, or weakness    Additional History: Alicia Waters is a 55 y.o. female with a PMHx of HTN, presenting to the ED with with gradually worsening, painful, leg swelling of the left inner knee and calf, starting 3 days ago. She denies any pain when bending the knee and had no fall recently, but does have some numbness in the area of the swelling. Pt has had no recent travel, or surgery with in the past 3 years. She has never had a blood clot and is not on any hormones. 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  She denies CP, n/v, fever, chills, or weakness.     PCP: Carney Living., MD  SPECIALISTS:    No current facility-administered medications for this encounter.     Current Outpatient Prescriptions   Medication Sig Dispense Refill   . oxaprozin (DAYPRO) 600 MG tablet Take 1,200 mg by mouth daily.     Marland Kitchen amLODIPine (NORVASC) 5 MG tablet Take 5 mg by mouth daily.     Marland Kitchen aspirin 325 MG EC tablet Take 325 mg by mouth daily.        . baclofen (LIORESAL) 10 MG tablet Take 10 mg by mouth 3 (three) times daily.     . DULoxetine (CYMBALTA) 30 MG capsule Take 30 mg by mouth daily.     . DULoxetine (CYMBALTA) 30 MG capsule Take 30 mg by mouth daily.     . eszopiclone (LUNESTA) 1 MG tablet  Take 1 mg by mouth nightly as needed. Take immediately before bedtime       . fexofenadine (ALLEGRA) 30 MG tablet Take 30 mg by mouth daily.     Marland Kitchen ipratropium (ATROVENT) 0.03 % nasal spray 2 sprays by Nasal route every 12 (twelve) hours.     . lidocaine (LIDODERM) 5 % Place 1 patch onto the skin daily. Remove & Discard patch within 12 hours or as directed by MD     . losartan-hydrochlorothiazide (HYZAAR) 100-25 MG per tablet Take 1 tablet by mouth daily.     . Multiple Vitamin (MULTIVITAMIN) capsule Take 1 capsule by mouth daily.     . pantoprazole (PROTONIX) 40 MG tablet Take 1 tablet (40 mg total) by mouth daily. 30 min before breakfast 30 tablet 0   . rosuvastatin (CRESTOR) 5 MG tablet Take 5 mg by mouth daily.     . traZODone (DESYREL) 50 MG tablet Take 50 mg by mouth nightly.         Past History     Past Medical History:  Past Medical History   Diagnosis Date   . Hypertensive disorder    .  Thyroid nodule    . Depression    . Mitral valve disorder    . Rheumatic fever    . Chronic back pain 02/11/2013       Past Surgical History:  Past Surgical History   Procedure Laterality Date   . Sinus surgery     . Throat biiopsy     . Cesarean section     . Cervical fusion  01/2012   . Mitral valve replacement         Family History:  Family History   Problem Relation Age of Onset   . Heart disease Maternal Grandmother    . Heart attack Maternal Uncle    . Heart attack Paternal Uncle    . Hypertension Sister      2 sisters, grandmother       Social History:  Social History   Substance Use Topics   . Smoking status: Former Smoker -- 1.00 packs/day for 20 years     Types: Cigarettes   . Smokeless tobacco: Never Used      Comment: Quit 2 months ago   . Alcohol Use: No       Allergies:  No Known Allergies    Review of Systems     Constitutional: Negative for fever or chills.   Neurological: Negative for speech changes, or weakness. Positive for numbness.  Eyes: Negative for visual changes or eye pain.  HENT: No headache.  Negative for sore throat, neck pain, or runny nose.   Cardiovascular: Negative for chest pain.   Respiratory: Negative for shortness of breath.   Gastrointestinal: Negative for abdominal pain, nausea, vomiting, diarrhea, or blood in stool.   Genitourinary: Negative for dysuria or hematuria.  Musculoskeletal: Negative for gait changes, joint pain or muscle pain. Positive for leg swelling and leg pain.  Skin: Negative for itching or rash.   Hematological: Negative for easy bruising      Physical Exam   BP 134/72 mmHg  Pulse 86  Temp(Src) 98.8 F (37.1 C) (Oral)  Resp 18  Ht 5\' 3"  (1.6 m)  Wt 83.915 kg  BMI 32.78 kg/m2  SpO2 98%    Physical Exam   Constitutional: Oriented to person, place, and time and well-developed, well-nourished, and in no distress.   Head: Normocephalic and atraumatic.   Mouth/Throat: Oropharynx is clear and moist.   Eyes: Conjunctivae normal and EOM are normal. Pupils are equal, round, and reactive to light.    Neck: Normal range of motion. Neck supple.   Cardiovascular: Normal rate, regular rhythm, normal heart sounds and intact distal pulses.  No murmur heard.  Pulmonary/Chest: Effort normal and breath sounds normal.   Abdominal: Soft. Non distended. Non tender. No rebound or guarding  Musculoskeletal: Mild fullness of the proximal, medial left calf. No overlying redness, no palpable vains or cords. Mild tenderness, non-fluctuant, and NROM. Normal pedal pulses, and no joint effusion of the knee.   Neurological: Patient is alert and oriented to person, place, and time. No cranial nerve deficit. Gait normal. GCS score is 15.   Skin: Skin is warm and dry. No rash  Psychiatric: Affect normal.       Diagnostic Study Results     Labs -     Results     ** No results found for the last 24 hours. **          Radiologic Studies -   Radiology Results (24 Hour)     Procedure Component Value Units Date/Time  [place] VenoDopp Low Extremity Left [[id number]] Collected:  08/22/15 1836    Order Status:   Completed Updated:  08/22/15 1841    Narrative:      CLINICAL INDICATION: Left lower extremity pain and swelling.    FINDINGS: Duplex evaluation of the deep venous system of the left lower  extremity was performed. The common femoral vein, femoral vein and  popliteal vein were visualized and appear unremarkable. These veins are  compressible with no evidence of thrombus. Spectral and color doppler  demonstrate patency and normal directional and phasic flow. Appropriate  augmentation was observed. The proximal deep femoral vein appears  unremarkable. Portions of the posterior tibial and peroneal veins were  seen and appear unremarkable with no thrombus noted.      The opposite proximal common femoral vein was imaged and appears normal.      Impression:       No evidence of left lower extremity deep vein thrombosis.            Kinnie Feil, MD   08/22/2015 6:37 PM        .    Medical Decision Making   I am the first provider for this patient.    I reviewed the vital signs, available nursing notes, past medical history, past surgical history, family history and social history.    Vital Signs-Reviewed the patient's vital signs.     Patient Vitals for the past 12 hrs:   BP Temp Pulse Resp   08/22/15 1606 134/72 mmHg 98.8 F (37.1 C) 86 18       Pulse Oximetry Analysis - Normal 98% on RA    Clinical Decision Support:       Old Medical Records:   Old medical records.  Nursing notes.     ED Course:     6:53 PM - Discussed imaging results with pt and counseled on diagnosis, f/u plans with PCP, and signs and symptoms when to return to ED.  Pt is stable and ready for discharge.       Provider Notes: L calf pain. No trauma or sign of infection. Doppler neg for DVT. Suspect likely musculoskeletal cause. Safe for d/c home. Ortho referral given.         Diagnosis     Clinical Impression:   1. Pain of left calf        Treatment Plan:   ED Disposition     Discharge Amado Coe discharge to home/self care.    Condition at  disposition: Stable              _______________________________      Attestations: This note is prepared by Areatha Keas, acting as scribe for Audley Hose, MD.    Audley Hose, MD - The scribe's documentation has been prepared under my direction and personally reviewed by me in its entirety.  I confirm that the note above accurately reflects all work, treatment, procedures, and medical decision making performed by me.    _______________________________    Cherlyn Roberts, MD  08/24/15 [phone]

## 2015-08-22 NOTE — ED Notes (Signed)
Bed: GR6  Expected date:   Expected time:   Means of arrival:   Comments:  Medic 202

## 2015-08-22 NOTE — Discharge Instructions (Signed)
Leg Pain    You have been diagnosed with leg pain.    Many things can cause leg pain. Most of the causes are not dangerous and will get better on their own. These can include muscle cramps, bruises, strains, pinched nerves, and minor skin infections.    You had an exam by your doctor today. He or she decided that the cause of your leg pain does not seem to be serious or dangerous.    During your visit, you had an ultrasound (duplex doppler) to make sure that you didn't have a blood clot in your leg.    If your pain continues, you might need another exam or more tests. This is to find out why you have leg pain. The cause of your symptoms doesn't seem dangerous now. You don't need to stay in the hospital.    Though we don't believe your condition is dangerous right now, it is important to be careful. Sometimes a problem that seems mild can become serious later. This is why it is very important that you return here or go to the nearest Emergency Department if you are not improving or your symptoms are getting worse.    Some things you may try at home are:    Over-the-counter pain medications like that have ibuprofen (Advil/Motrin) or acetaminophen (Tylenol) in them. Follow the directions on the package.   Rest the leg as needed. As soon as you are able, start moving your leg again to keep it from getting stiff.    Return here or go to the nearest Emergency Department or follow-up with your doctor if you are not getting better as expected.    Follow the instructions for any medication you are prescribed.     YOU SHOULD SEEK MEDICAL ATTENTION IMMEDIATELY, EITHER HERE OR AT THE NEAREST EMERGENCY DEPARTMENT, IF ANY OF THE FOLLOWING HAPPENS:   You have a fever (temperature higher than 100.4F or 38C).   Your pain does not go away or gets worse.   Your leg or joints (hip, knee, ankle, etc.) are red or swollen.   Your chest hurts or you get short of breath.   You have any other symptoms or concerns, or  don't get better as expected.    If you can't follow up with your doctor, or if at any time you feel you need to be rechecked or seen again, come back here or go to the nearest emergency department.

## 2017-06-08 ENCOUNTER — Emergency Department
Admission: EM | Admit: 2017-06-08 | Discharge: 2017-06-08 | Disposition: A | Discharge status: Home / Self Care | Payer: TRICARE Prime—HMO | Attending: Emergency Medicine | Admitting: Emergency Medicine

## 2017-06-08 ENCOUNTER — Emergency Department: Payer: TRICARE Prime—HMO

## 2017-06-08 DIAGNOSIS — Z952 Presence of prosthetic heart valve: Secondary | ICD-10-CM | POA: Insufficient documentation

## 2017-06-08 DIAGNOSIS — Z87891 Personal history of nicotine dependence: Secondary | ICD-10-CM | POA: Insufficient documentation

## 2017-06-08 DIAGNOSIS — I10 Essential (primary) hypertension: Secondary | ICD-10-CM

## 2017-06-08 DIAGNOSIS — R51 Headache: Secondary | ICD-10-CM | POA: Insufficient documentation

## 2017-06-08 DIAGNOSIS — Z7982 Long term (current) use of aspirin: Secondary | ICD-10-CM | POA: Insufficient documentation

## 2017-06-08 DIAGNOSIS — Z79899 Other long term (current) drug therapy: Secondary | ICD-10-CM | POA: Insufficient documentation

## 2017-06-08 DIAGNOSIS — G8929 Other chronic pain: Secondary | ICD-10-CM | POA: Insufficient documentation

## 2017-06-08 DIAGNOSIS — H538 Other visual disturbances: Secondary | ICD-10-CM | POA: Insufficient documentation

## 2017-06-08 LAB — CBC AND DIFFERENTIAL
Absolute NRBC: 0 10*3/uL (ref 0.00–0.00)
Basophils Absolute Automated: 0.01 10*3/uL (ref 0.00–0.08)
Basophils Automated: 0.2 %
Eosinophils Absolute Automated: 0.11 10*3/uL (ref 0.00–0.44)
Eosinophils Automated: 1.7 %
Hematocrit: 42.3 % (ref 34.7–43.7)
Hgb: 13.8 g/dL (ref 11.4–14.8)
Immature Granulocytes Absolute: 0.01 10*3/uL (ref 0.00–0.07)
Immature Granulocytes: 0.2 %
Lymphocytes Absolute Automated: 1.86 10*3/uL (ref 0.42–3.22)
Lymphocytes Automated: 28.7 %
MCH: 31.1 pg (ref 25.1–33.5)
MCHC: 32.6 g/dL (ref 31.5–35.8)
MCV: 95.3 fL (ref 78.0–96.0)
MPV: 8.9 fL (ref 8.9–12.5)
Monocytes Absolute Automated: 0.7 10*3/uL (ref 0.21–0.85)
Monocytes: 10.8 %
Neutrophils Absolute: 3.78 10*3/uL (ref 1.10–6.33)
Neutrophils: 58.4 %
Nucleated RBC: 0 /100 WBC (ref 0.0–0.0)
Platelets: 234 10*3/uL (ref 142–346)
RBC: 4.44 10*6/uL (ref 3.90–5.10)
RDW: 13 % (ref 11–15)
WBC: 6.47 10*3/uL (ref 3.10–9.50)

## 2017-06-08 LAB — BASIC METABOLIC PANEL
BUN: 9 mg/dL (ref 7.0–19.0)
CO2: 29 mEq/L (ref 22–29)
Calcium: 9.5 mg/dL (ref 8.5–10.5)
Chloride: 106 mEq/L (ref 100–111)
Creatinine: 0.8 mg/dL (ref 0.6–1.0)
Glucose: 109 mg/dL — ABNORMAL HIGH (ref 70–100)
Potassium: 3.7 mEq/L (ref 3.5–5.1)
Sodium: 141 mEq/L (ref 136–145)

## 2017-06-08 LAB — ECG 12-LEAD
Atrial Rate: 59 {beats}/min
P Axis: 60 degrees
P-R Interval: 158 ms
Q-T Interval: 494 ms
QRS Duration: 82 ms
QTC Calculation (Bezet): 489 ms
R Axis: 48 degrees
T Axis: 102 degrees
Ventricular Rate: 59 {beats}/min

## 2017-06-08 LAB — GFR: EGFR: >60

## 2017-06-08 LAB — I-STAT TROPONIN: i-STAT Troponin: 0.01 ng/mL (ref 0.00–0.09)

## 2017-06-08 MED ORDER — HYDRALAZINE HCL 20 MG/ML IJ SOLN
10.0000 mg | Freq: Once | INTRAMUSCULAR | Status: DC
Start: 2017-06-08 — End: 2017-06-08
  Filled 2017-06-08: qty 1

## 2017-06-08 MED ORDER — ACETAMINOPHEN 500 MG PO TABS
1000.0000 mg | ORAL_TABLET | Freq: Once | ORAL | Status: AC
Start: 2017-06-08 — End: 2017-06-08
  Administered 2017-06-08: 1000 mg via ORAL
  Filled 2017-06-08: qty 2

## 2017-06-08 NOTE — Discharge Instructions (Signed)
Dear Alicia Waters:    Thank you for choosing the [facility] Emergency Department, the premier emergency department in the [place] area.  I hope your visit today was EXCELLENT.    Specific instructions for your visit today:    Call your PCP today to schedule an appointment for the next 1-2 days.  Follow-up with neurology for outpatient MRI and further evaluation of chronic headache.  Continue Tylenol for headache at home.  Return to ED if symptoms worsen or any concerning changes.        Headache    You have been treated for a headache.    Headaches are very common. Most of the time they are benign (not harmful). Some headaches can be very serious. Your headache appears to be benign. The doctor feels it is OK for you to go home.    If you continue to have headaches, or if this headache does not resolve over the next few days, you should be evaluated by your regular doctor or a neurologist. Keep a "headache diary." This may help your doctor learn the cause of your headaches.    Take your headache medication as directed. This is especially important if your doctor has placed you on a daily medication to prevent headaches.    YOU SHOULD SEEK MEDICAL ATTENTION IMMEDIATELY, EITHER HERE OR AT THE NEAREST EMERGENCY DEPARTMENT, IF ANY OF THE FOLLOWING OCCURS:   Your headache gets worse.   You have a severe headache that occurs suddenly.   Your head pain is different from your normal headache.   You have a fever (temperature higher than 100.69F / 38C), especially with a stiff neck.   You feel numbness, tingling, or weakness in your arms or legs.   You pass out.   You have problems with your vision.   You vomit and have trouble taking medication or keeping it down.               Hypertension    You have been diagnosed with elevated blood pressure.    The medical term for high blood pressure is hypertension. Many people feel anxious or uncomfortable about being at the hospital. If you feel anxious today,  this could make your blood pressure appear high, even if your blood pressure is usually normal. Check your blood pressure several more times when you are not feeling stress. Keep a record of these readings and give this information to your regular doctor. He or she will decide whether you have hypertension that requires medical treatment.    If your blood pressure becomes extremely high all of a sudden, you will probably notice symptoms. In fact, very high blood pressure is a medical emergency. Most people with hypertension have blood pressure that is only a little too high. Mild high blood pressure does not cause specific symptoms. Instead, the effects of hypertension develop slowly over time. Untreated hypertension can affect the heart, brain, kidneys, eyes, and blood vessels. Unfortunately, by the time side-effects become noticeable, the body has already been damaged. This is why hypertension is called "the silent killer!"    It is important to follow up with your regular doctor. Check your blood pressure several times in the next 1 to 2 weeks and tell your doctor about the results. It may be helpful to keep a log or a journal where you can write down your blood pressures. Note the time of day and the activity you were doing when the reading was taken.  YOU SHOULD SEEK MEDICAL ATTENTION IMMEDIATELY, EITHER HERE OR AT THE NEAREST EMERGENCY DEPARTMENT, IF ANY OF THE FOLLOWING OCCURS:   You have a headache.   You have chest pain.   You are short of breath or have trouble breathing.    You feel weak, especially on only one side of the body.   Your symptoms get worse or you have other concerns.                 If you do not continue to improve or your condition worsens, please contact your doctor or return immediately to the Emergency Department.    Sincerely,  Mignon Pine, MD  Attending Emergency Physician  [facility] Emergency Department    ONSITE PHARMACY  Our full service onsite  pharmacy is located in the ER waiting room.  Open 7 days a week from 9 am to 9 pm.  We accept all major insurances and prices are competitive with major retailers.  Ask your provider to print your prescriptions down to the pharmacy to speed you on your way home.    OBTAINING A PRIMARY CARE APPOINTMENT    Primary care physicians (PCPs, also known as primary care doctors) are either internists or family medicine doctors. Both types of PCPs focus on health promotion, disease prevention, patient education and counseling, and treatment of acute and chronic medical conditions.    Call for an appointment with a primary care doctor.  Ask to see who is taking new patients.     [facility]  telephone:  [phone]  [email or web address]    DOCTOR REFERRALS  Call [phone] (available 24 hours a day, 7 days a week) if you need any further referrals and we can help you find a primary care doctor or specialist.  Also, available online at:  [email or web address]    YOUR CONTACT INFORMATION  Before leaving please check with registration to make sure we have an up-to-date contact number.  You can call registration at [phone] to update your information.  For questions about your hospital bill, please call [phone].  For questions about your Emergency Dept Physician bill please call [phone].      FREE HEALTH SERVICES  If you need help with health or social services, please call 2-1-1 for a free referral to resources in your area.  2-1-1 is a free service connecting people with information on health insurance, free clinics, pregnancy, mental health, dental care, food assistance, housing, and substance abuse counseling.  Also, available online at:  http://www.[facility].org    MEDICAL RECORDS AND TESTS  Certain laboratory test results do not come back the same day, for example urine cultures.   We will contact you if other important findings are noted.  Radiology films are often reviewed  again to ensure accuracy.  If there is any discrepancy, we will notify you.      Please call [phone] to pick up a complimentary CD of any radiology studies performed.  If you or your doctor would like to request a copy of your medical records, please call [phone].      ORTHOPEDIC INJURY   Please know that significant injuries can exist even when an initial x-ray is read as normal or negative.  This can occur because some fractures (broken bones) are not initially visible on x-rays.  For this reason, close outpatient follow-up with your primary care doctor or bone specialist (orthopedist) is required.    MEDICATIONS AND FOLLOWUP  Please be aware that some prescription  medications can cause drowsiness.  Use caution when driving or operating machinery.    The examination and treatment you have received in our Emergency Department is provided on an emergency basis, and is not intended to be a substitute for your primary care physician.  It is important that your doctor checks you again and that you report any new or remaining problems at that time.      24 HOUR PHARMACIES  The nearest 24 hour pharmacy is:    [facility]  [address]  [place], [place] [address]  [phone]      ASSISTANCE WITH INSURANCE    Affordable Care Act  [facility])  Call to start or finish an application, compare plans, enroll or ask a question.  [phone]  TTY: [phone]  Web:  Healthcare.gov    Help Enrolling in [facility]  [employer] [place]  9895037707 (TOLL-FREE)  404-078-1840 (TTY)  Web:  Http://www.coverva.org    Local Help Enrolling in the [facility]  [place] [facility]  (515) 648-5747 (MAIN)  Email:  health-help@nvfs .org  Web:  BlackjackMyths.is  Address:  [address] [place], [place] [address]    SEDATING MEDICATIONS  Sedating medications include strong pain medications (e.g. narcotics), muscle relaxers, benzodiazepines (used for anxiety and as muscle relaxers),  Benadryl/diphenhydramine and other antihistamines for allergic reactions/itching, and other medications.  If you are unsure if you have received a sedating medication, please ask your physician or nurse.  If you received a sedating medication: DO NOT drive a car. DO NOT operate machinery. DO NOT perform jobs where you need to be alert.  DO NOT drink alcoholic beverages while taking this medicine.     If you get dizzy, sit or lie down at the first signs. Be careful going up and down stairs.  Be extra careful to prevent falls.     Never give this medicine to others.     Keep this medicine out of reach of children.     Do not take or save old medicines. Throw them away when outdated.     Keep all medicines in a cool, dry place. DO NOT keep them in your bathroom medicine cabinet or in a cabinet above the stove.    MEDICATION REFILLS  Please be aware that we cannot refill any prescriptions through the ER. If you need further treatment from what is provided at your ER visit, please follow up with your primary care doctor or your pain management specialist.    FREESTANDING EMERGENCY DEPARTMENTS OF [facility]  Did you know [facility] has two freestanding ERs located just a few miles away?  [facility] [place] and [facility] [place] have short wait times, easy free parking directly in front of the building and top patient satisfaction scores - and the same Board Certified Emergency Medicine doctors as [facility].

## 2017-06-08 NOTE — ED Provider Notes (Signed)
[facility] EMERGENCY DEPARTMENT H&P                                             ATTENDING SUPERVISORY NOTE       ATTENDING NOTE      57 y.o. female with h/o HTN, mitral valve replacement (2015) who presents for headaches a/w HTN for the past 2 months. Saw PCP 1 week ago who added 25 mg losartan for her headaches and BP. She endorses blurred vision and nausea starting this past week, but denies fever or diarrhea. Took Tylenol with mild relief.     Constitutional: Awake and alert. No distress.   Head: Normocephalic and atraumatic.   Eyes: Pupils are equal, round, and reactive to light.   ENT: MMM   Neck: Neck supple.   Cardiovascular: Normal rate and regular rhythm.    Pulmonary/Chest: Effort normal and breath sounds normal. No respiratory distress.   Abdominal: Soft. Bowel sounds are normal. There is no tenderness. No rebound or guarding.  Musculoskeletal: no edema.   Neurological: alert and oriented to person, place, and time. No cranial nerve deficit. Motor and sensory function is intact. Cerebellar function intact including finger to nose testing.   Skin: warm and dry. Pt is not diaphoretic.   Psychiatric: normal mood and affect.         I spoke to and examined the patient as well: Yes  I was present during key portions of any procedures performed: N/A            VISIT INFORMATION        Clinical Course in the ED:          Given duration of HA CT head obtained, no focal neuro deficit. ECG, trop, bmp, cbc reviewed. Needs urgent neurology f/u, midlevel to discuss with neurology office. Pt and husband updated on all the results. Strict return precautions.          Medications Given in the ED:    .     ED Medication Orders     Start Ordered     Status Ordering Provider    06/08/17 1106 06/08/17 1105    Once     Route: Intravenous  Ordered Dose: 10 mg     Discontinued Mignon Pine    06/08/17 1041 06/08/17 1040  acetaminophen (TYLENOL) tablet 1,000 mg  Once     Route: Oral   Ordered Dose: 1,000 mg     Last MAR action:  Given Faigy Stretch            Procedures:            Interpretations:      O2 sat-           saturation: 98 %; Oxygen use: room air; Interpretation: Normal    EKG -             interpreted by me: sinus bradycardia at 59.  No STEMI. Reviewed previous EKG from 10/14/2016, which is similar  Head CT/trauma - Severe Headache, duration               PAST HISTORY        Primary Care Provider: Carney Living., MD        PMH/PSH:    .     Past Medical History:   Diagnosis Date   . Chronic  back pain 02/11/2013   . Depression    . Hypertensive disorder    . Mitral valve disorder    . Rheumatic fever    . Thyroid nodule        She has a past surgical history that includes Sinus surgery; throat biiopsy; Cesarean section; Cervical fusion (01/2012); and Mitral valve replacement.      Social/Family History:      She reports that she has quit smoking. Her smoking use included Cigarettes. She has a 20.00 pack-year smoking history. She has never used smokeless tobacco. She reports that she does not drink alcohol or use drugs.    Family History   Problem Relation Age of Onset   . Heart disease Maternal Grandmother    . Heart attack Maternal Uncle    . Heart attack Paternal Uncle    . Hypertension Sister         2 sisters, grandmother         Listed Medications on Arrival:    .     Home Medications     Med List Status:  In Progress Set By: Alain Marion, RN at 06/08/2017 10:45 AM                amLODIPine (NORVASC) 5 MG tablet     Take 5 mg by mouth daily.     aspirin 325 MG EC tablet     Take 325 mg by mouth daily.        baclofen (LIORESAL) 10 MG tablet     Take 10 mg by mouth 3 (three) times daily.     carvedilol (COREG CR) 10 MG 24 hr capsule     Take 10 mg by mouth daily.     DULoxetine (CYMBALTA) 30 MG capsule     Take 30 mg by mouth daily.     DULoxetine (CYMBALTA) 30 MG capsule     Take 30 mg by mouth daily.     eszopiclone (LUNESTA) 1 MG tablet     Take 1 mg by  mouth nightly as needed. Take immediately before bedtime       fexofenadine (ALLEGRA) 30 MG tablet     Take 30 mg by mouth daily.     hydroCHLOROthiazide (MICROZIDE) 12.5 MG capsule     Take 12.5 mg by mouth daily.     ipratropium (ATROVENT) 0.03 % nasal spray     2 sprays by Nasal route every 12 (twelve) hours.     lidocaine (LIDODERM) 5 %     Place 1 patch onto the skin daily. Remove & Discard patch within 12 hours or as directed by MD     losartan (COZAAR) 25 MG tablet     Take 25 mg by mouth daily.     losartan-hydrochlorothiazide (HYZAAR) 100-25 MG per tablet     Take 1 tablet by mouth daily.     Multiple Vitamin (MULTIVITAMIN) capsule     Take 1 capsule by mouth daily.     oxaprozin (DAYPRO) 600 MG tablet     Take 1,200 mg by mouth daily.     pantoprazole (PROTONIX) 40 MG tablet     Take 1 tablet (40 mg total) by mouth daily. 30 min before breakfast     rosuvastatin (CRESTOR) 5 MG tablet     Take 5 mg by mouth daily.     traZODone (DESYREL) 50 MG tablet     Take 50 mg by mouth nightly.  Allergies: She has No Known Allergies.            RESULTS        Lab Results:      Results     Procedure Component Value Units Date/Time    Basic Metabolic Panel [[id number]]  (Abnormal) Collected:  06/08/17 1107    Specimen:  Blood Updated:  06/08/17 1131     Glucose 109 (H) mg/dL      BUN 9.0 mg/dL      Creatinine 0.8 mg/dL      Calcium 9.5 mg/dL      Sodium 409 mEq/L      Potassium 3.7 mEq/L      Chloride 106 mEq/L      CO2 29 mEq/L     GFR [[id number]] Collected:  06/08/17 1107     Updated:  06/08/17 1131     EGFR >60.0    i-Stat Troponin [[id number]] Collected:  06/08/17 1105     Updated:  06/08/17 1127     i-STAT Troponin 0.01 ng/mL     CBC with differential [[id number]] Collected:  06/08/17 1107    Specimen:  Blood from Blood Updated:  06/08/17 1124     WBC 6.47 x10 3/uL      Hgb 13.8 g/dL      Hematocrit 52.8 %      Platelets 234 x10 3/uL      RBC 4.44 x10 6/uL      MCV 95.3 fL      MCH 31.1 pg      MCHC 32.6 g/dL       RDW 13 %      MPV 8.9 fL      Neutrophils 58.4 %      Lymphocytes Automated 28.7 %      Monocytes 10.8 %      Eosinophils Automated 1.7 %      Basophils Automated 0.2 %      Immature Granulocyte 0.2 %      Nucleated RBC 0.0 /100 WBC      Neutrophils Absolute 3.78 x10 3/uL      Abs Lymph Automated 1.86 x10 3/uL      Abs Mono Automated 0.70 x10 3/uL      Abs Eos Automated 0.11 x10 3/uL      Absolute Baso Automated 0.01 x10 3/uL      Absolute Immature Granulocyte 0.01 x10 3/uL      Absolute NRBC 0.00 x10 3/uL               Radiology Results:      XR Chest  AP Portable   Final Result      No active disease      Jerald Kief, MD    06/08/2017 12:17 PM      CT Head without Contrast   Final Result    No acute intracranial abnormality is seen.              Leandro Reasoner, MD    06/08/2017 11:55 AM                  Attending Attestation:      The patient was seen and examined by the mid-level (physician assistant or nurse practitioner), or fellow, and the plan of care was discussed with me. I agree with the plan as it was presented to me.  I have reviewed and agree with the final ED diagnosis.  Scribe Attestation:      I was acting as a Neurosurgeon for Mignon Pine, MD on [employer]  Treatment Team: Scribe: Lamar Benes     I am the first provider for this patient and I personally performed the services documented. Treatment Team: Scribe: Lamar Benes is scribing for me on [facility]. This note and the patient instructions accurately reflect work and decisions made by me.  Mignon Pine, MD          Mignon Pine, MD  06/08/17 1946

## 2017-06-08 NOTE — ED Notes (Signed)
istat trop 0.01

## 2017-06-08 NOTE — ED Provider Notes (Signed)
[facility] EMERGENCY DEPARTMENT APP H&P      Visit date: 06/08/2017      CLINICAL SUMMARY          Diagnosis:    .     Final diagnoses:   Chronic nonintractable headache, unspecified headache type   Essential hypertension         MDM Notes:    Well-appearing 57 year old presents with 2 months of daily headaches and hypertension.  Has seen her PCP for the same last week and had low certain added to her regimen.  No new symptoms.  Will order CT of head, chest x-ray, troponin, basic labs and EKG.  Differential diagnoses include but are not limited to: Headache/migraine, intracranial mass/hemorrhage, CVA, MI, arrhythmia, meningitis           Disposition:         Discharge         Discharge Prescriptions     None                      CLINICAL INFORMATION        HPI:      Chief Complaint: Hypertension and Headache  .    Alicia Waters is a 57 y.o. female who presents with daily headaches (over her entire head) for the past 2 months.  Saw her PCP 1 week ago for the headaches and BP and he added Losartan 25mg  to her existing regimen.  Complains of blurred vision of the past week.  +nausea but denies vomiting or fever.  States that she had a mitral valve replacement in 2015 and was told recently that the valve is not in the correct place and is causing blood to pool in the heart.  Also complains of her legs "feeling funny" when she is sleeping, more on the L side than the R.  States that HA are slightly improved by Tylenol.      History obtained from: Patient          ROS:      Positive and negative ROS elements as per HPI.  All other systems reviewed and negative.      Physical Exam:      Pulse 70  BP 187/89  Resp 18  SpO2 98 %  Temp 97.8 F (36.6 C)    Physical Exam   Constitutional: She is oriented to person, place, and time. She appears well-developed and well-nourished.   HENT:   Head: Normocephalic and atraumatic.   Nose: Nose normal.   Mouth/Throat: Oropharynx is clear and moist.    Eyes: Pupils are equal, round, and reactive to light. Conjunctivae and EOM are normal.   Neck: Normal range of motion. Neck supple.   Cardiovascular: Normal rate, regular rhythm and normal heart sounds.    Pulmonary/Chest: Effort normal and breath sounds normal.   Abdominal: Soft. Bowel sounds are normal. There is no tenderness.   Musculoskeletal: She exhibits no edema.   Neurological: She is alert and oriented to person, place, and time. She exhibits normal muscle tone. Coordination normal.   Equal and strong grips bilaterally.  Equal and strong LE strength.     Skin: Skin is warm and dry. No rash noted.   Psychiatric: She has a normal mood and affect.   Nursing note and vitals reviewed.                PAST HISTORY        Primary Care Provider: Shaune Pollack  Alvan Dame., MD        PMH/PSH:    .     Past Medical History:   Diagnosis Date   . Chronic back pain 02/11/2013   . Depression    . Hypertensive disorder    . Mitral valve disorder    . Rheumatic fever    . Thyroid nodule        She has a past surgical history that includes Sinus surgery; throat biiopsy; Cesarean section; Cervical fusion (01/2012); and Mitral valve replacement.      Social/Family History:      She reports that she has quit smoking. Her smoking use included Cigarettes. She has a 20.00 pack-year smoking history. She has never used smokeless tobacco. She reports that she does not drink alcohol or use drugs.    Family History   Problem Relation Age of Onset   . Heart disease Maternal Grandmother    . Heart attack Maternal Uncle    . Heart attack Paternal Uncle    . Hypertension Sister         2 sisters, grandmother         Listed Medications on Arrival:    .     Home Medications     Med List Status:  In Progress Set By: Alain Marion, RN at 06/08/2017 10:45 AM                amLODIPine (NORVASC) 5 MG tablet     Take 5 mg by mouth daily.     aspirin 325 MG EC tablet     Take 325 mg by mouth daily.        baclofen (LIORESAL) 10 MG tablet      Take 10 mg by mouth 3 (three) times daily.     carvedilol (COREG CR) 10 MG 24 hr capsule     Take 10 mg by mouth daily.     DULoxetine (CYMBALTA) 30 MG capsule     Take 30 mg by mouth daily.     DULoxetine (CYMBALTA) 30 MG capsule     Take 30 mg by mouth daily.     eszopiclone (LUNESTA) 1 MG tablet     Take 1 mg by mouth nightly as needed. Take immediately before bedtime       fexofenadine (ALLEGRA) 30 MG tablet     Take 30 mg by mouth daily.     hydroCHLOROthiazide (MICROZIDE) 12.5 MG capsule     Take 12.5 mg by mouth daily.     ipratropium (ATROVENT) 0.03 % nasal spray     2 sprays by Nasal route every 12 (twelve) hours.     lidocaine (LIDODERM) 5 %     Place 1 patch onto the skin daily. Remove & Discard patch within 12 hours or as directed by MD     losartan (COZAAR) 25 MG tablet     Take 25 mg by mouth daily.     losartan-hydrochlorothiazide (HYZAAR) 100-25 MG per tablet     Take 1 tablet by mouth daily.     Multiple Vitamin (MULTIVITAMIN) capsule     Take 1 capsule by mouth daily.     oxaprozin (DAYPRO) 600 MG tablet     Take 1,200 mg by mouth daily.     pantoprazole (PROTONIX) 40 MG tablet     Take 1 tablet (40 mg total) by mouth daily. 30 min before breakfast     rosuvastatin (CRESTOR) 5 MG tablet  Take 5 mg by mouth daily.     traZODone (DESYREL) 50 MG tablet     Take 50 mg by mouth nightly.          Allergies: She has No Known Allergies.            VISIT INFORMATION        Clinical Course in the ED:    Attempted to call patient's primary care doctor several times and fell and continues to ring without answering.   Reviewed CT, x-ray and laboratory results with patient.  Recommended that she contact her primary care for follow-up in the 1-2 days as well as with neurology for further evaluation and outpatient MRI.  Patient's husband states that they might use a neurologist at [facility] for further evaluation.  Recommended she continue her current antihypertensive medication regimen as well as Tylenol for  headache pain. Patient and husband agree with plan and are ready for discharge with no further questions at this time.        Medications Given in the ED:    .     ED Medication Orders     Start Ordered     Status Ordering Provider    06/08/17 1106 06/08/17 1105  hydrALAZINE (APRESOLINE) injection 10 mg  Once     Route: Intravenous  Ordered Dose: 10 mg     Last MAR action:  Hold Alfonzo Feller, [facility]    06/08/17 1041 06/08/17 1040  acetaminophen (TYLENOL) tablet 1,000 mg  Once     Route: Oral  Ordered Dose: 1,000 mg     Last MAR action:  Given BENGHANEM, GHOFRANE            Procedures:      Procedures      Interpretations:      O2 sat-           saturation: 98 %; Oxygen use: room air; Interpretation: Normal                 RESULTS        Lab Results:      Results     Procedure Component Value Units Date/Time    Basic Metabolic Panel [[id number]]  (Abnormal) Collected:  06/08/17 1107    Specimen:  Blood Updated:  06/08/17 1131     Glucose 109 (H) mg/dL      BUN 9.0 mg/dL      Creatinine 0.8 mg/dL      Calcium 9.5 mg/dL      Sodium 308 mEq/L      Potassium 3.7 mEq/L      Chloride 106 mEq/L      CO2 29 mEq/L     GFR [[id number]] Collected:  06/08/17 1107     Updated:  06/08/17 1131     EGFR >60.0    i-Stat Troponin [[id number]] Collected:  06/08/17 1105     Updated:  06/08/17 1127     i-STAT Troponin 0.01 ng/mL     CBC with differential [[id number]] Collected:  06/08/17 1107    Specimen:  Blood from Blood Updated:  06/08/17 1124     WBC 6.47 x10 3/uL      Hgb 13.8 g/dL      Hematocrit 66.4 %      Platelets 234 x10 3/uL      RBC 4.44 x10 6/uL      MCV 95.3 fL      MCH 31.1 pg      MCHC 32.6 g/dL  RDW 13 %      MPV 8.9 fL      Neutrophils 58.4 %      Lymphocytes Automated 28.7 %      Monocytes 10.8 %      Eosinophils Automated 1.7 %      Basophils Automated 0.2 %      Immature Granulocyte 0.2 %      Nucleated RBC 0.0 /100 WBC      Neutrophils Absolute 3.78 x10 3/uL      Abs Lymph Automated 1.86 x10 3/uL      Abs Mono  Automated 0.70 x10 3/uL      Abs Eos Automated 0.11 x10 3/uL      Absolute Baso Automated 0.01 x10 3/uL      Absolute Immature Granulocyte 0.01 x10 3/uL      Absolute NRBC 0.00 x10 3/uL               Radiology Results:      XR Chest  AP Portable   Final Result      No active disease      Jerald Kief, MD    06/08/2017 12:17 PM      CT Head without Contrast   Final Result    No acute intracranial abnormality is seen.              Leandro Reasoner, MD    06/08/2017 11:55 AM                  Scribe Attestation:      No scribe involved in the care of this patient          Illa Level, FNP  06/08/17 1402

## 2017-12-12 ENCOUNTER — Other Ambulatory Visit: Payer: Self-pay

## 2017-12-12 DIAGNOSIS — M542 Cervicalgia: Secondary | ICD-10-CM

## 2017-12-14 ENCOUNTER — Ambulatory Visit: Payer: TRICARE Prime—HMO

## 2017-12-14 DIAGNOSIS — M542 Cervicalgia: Secondary | ICD-10-CM

## 2018-11-05 ENCOUNTER — Ambulatory Visit (FREE_STANDING_LABORATORY_FACILITY): Payer: TRICARE Prime—HMO

## 2018-11-05 DIAGNOSIS — Z01818 Encounter for other preprocedural examination: Secondary | ICD-10-CM

## 2018-11-06 LAB — COVID-19 (SARS-COV-2): SARS CoV 2 Overall Result: NOT DETECTED

## 2018-12-04 ENCOUNTER — Other Ambulatory Visit (INDEPENDENT_AMBULATORY_CARE_PROVIDER_SITE_OTHER): Payer: Self-pay | Admitting: Urgent Care

## 2018-12-06 ENCOUNTER — Ambulatory Visit (FREE_STANDING_LABORATORY_FACILITY): Payer: TRICARE Prime—HMO

## 2018-12-06 DIAGNOSIS — Z1159 Encounter for screening for other viral diseases: Secondary | ICD-10-CM

## 2018-12-06 DIAGNOSIS — Z01818 Encounter for other preprocedural examination: Secondary | ICD-10-CM

## 2018-12-07 ENCOUNTER — Other Ambulatory Visit (INDEPENDENT_AMBULATORY_CARE_PROVIDER_SITE_OTHER): Payer: Self-pay | Admitting: Urgent Care

## 2018-12-07 LAB — COVID-19 (SARS-COV-2): SARS CoV 2 Overall Result: NOT DETECTED

## 2018-12-13 ENCOUNTER — Ambulatory Visit (FREE_STANDING_LABORATORY_FACILITY): Payer: TRICARE Prime—HMO

## 2018-12-13 DIAGNOSIS — Z01818 Encounter for other preprocedural examination: Secondary | ICD-10-CM

## 2018-12-13 DIAGNOSIS — Z1159 Encounter for screening for other viral diseases: Secondary | ICD-10-CM

## 2018-12-14 LAB — COVID-19 (SARS-COV-2): SARS CoV 2 Overall Result: NOT DETECTED

## 2019-02-05 ENCOUNTER — Ambulatory Visit (INDEPENDENT_AMBULATORY_CARE_PROVIDER_SITE_OTHER): Payer: TRICARE Prime—HMO

## 2019-02-05 DIAGNOSIS — Z01818 Encounter for other preprocedural examination: Secondary | ICD-10-CM

## 2019-02-05 DIAGNOSIS — Z1159 Encounter for screening for other viral diseases: Secondary | ICD-10-CM

## 2019-02-07 LAB — COVID-19 (SARS-COV-2): SARS CoV 2 Overall Result: NOT DETECTED

## 2020-03-05 ENCOUNTER — Encounter (HOSPITAL_BASED_OUTPATIENT_CLINIC_OR_DEPARTMENT_OTHER): Payer: Self-pay

## 2020-03-05 NOTE — Progress Notes (Signed)
CALL CENTER [facility]al Health Prescreen     Patients Name (ALWAYS update Registration for Patient): Alicia Waters      Please note that [facility] is a [facility] that provides same day or next day psychiatric assessment appointments. However, Due to the current pandemic, walk-in services are on hold and they are scheduling appointments. Appointments are either via MyChart or In Person [facility] Monday-Thursday and MyChart Video only on Friday. [facility] does not prescribe controlled medications such as stimulants, benzo's or opiates and they also do not provide ongoing psychiatric care.    Would you like to continue with the prescreen for [facility] scheduling: yes     Do you require Hard of Hearing Services? Yes, I wear a hearing aid     It is a requirement that you are within the [place] for all [facility] Telemedicine visit. Do you have any concerns about your ability to do this? no   (If Patient resides outside of [place], to have to be scheduled to come in person)      Reason for Referral:  Patient stated "I feel depressed with lack of energy and trouble sleeping. I looking for assessment with a psychiatrist for a refill on my medication."    Are you presently receiving behavioral health services from an outpatient provider?  no  If currently seeing a psychiatric prescriber, why are you seeking services at [facility]?   Note: [facility] does not provide second opinion.     Are you currently experiencing Suicidal ideation with a plan or intent? no   If Yes, explain:  Note: Note: REFER OUT IF APPROPRIATE    Are you currently experiencing Homicidal ideation with a plan or intent? no    If Yes, explain:  Note: Note: REFER OUT IF APPROPRIATE    Are you currently using alcohol or drugs? no   Note: REFER OUT IF APPROPRIATE    Additional Comment: Pt requested that once she is schedule with a provider she will need the providers NPI # and doctor name for [employer].    Reminders:  ? To cancel or reschedule  please call us as soon as possible.   ? Please have the most up to date list/with dosages of your medication for your appointment & pharmacy information  ? Please note that all visits occur via MyChart or In person [facility]   ? If you feel the need for urgent care, before your scheduled appointment, please seek emergency care immediately.     Psychiatry  . If a Pt is requesting non-clinical documentation (i.e. Disability evaluation), please refer to the following information:  . Our mental health services are therapeutic in nature and assessment is limited to clinical evaluation and treatment.  Assessments that do not fall under [facility]'s scope of services include:  Court-ordered compliance   Evidence of disability   Court mandated services resulting in the provider testifying on your behalf  Expert witnesses in court cases  Impressions for or fitness for custody   Evaluations for emotional support animals  Disability or FMLA paperwork  Psychological testing (includes ADHD testing and dementia evaluation)

## 2020-03-05 NOTE — Progress Notes (Signed)
[facility] Prescreen Additional Information:      This Clinical research associate contacted Pt regarding [facility] scheduling.     "You are seeking a psychiatric evaluation for medication management is that correct?" yes    (If Pt reports "No" discontinue scheduling. Pt does not meet criteria)        Psychiatrist -  Patient stated, no    S/I -  Patient stated, no    H/I -  Patient stated,  no    Psychiatric Medication - Patient stated, yes        Psychiatric Medications :  Who prescribes Medications? Patient stated, " the last psychiatrist I was seeing prescribed the medication.  I last saw them last month.  Its hard to get an appointment and get them to call back.   I don't have plans on returning to them.  I take the medication for depression."     - Duloxetine - 60 mg - 2 pills a day    MyChart Activation Reminder      PAA reminded Pt that [facility] does not provide established psychiatric care, prescribed controlled medications. PAA encouraged Pt to contact insurance panel and PCP for additional in-network providers.     Pt reports clear and verbal understanding.    What type of appointment would you prefer? (Please bold what the patient prefers; for options 2 and 3, please inform patient they will need to wear a mask the entire time they are at [facility])  1) Virtual appointment  2) In clinic virtual appointment  3) In clinic face-to-face appointment     Are you currently experiencing fever, cough or shortness of breath?  n/a  Are you currently experiencing chills, sore throat, new headache, loss of taste or smell, or body aches not attributable to physical activity?  n/a  Have you had close contact with a COVID-19 patient?  n/a  Have you recently been tested for COVID-19?  n/a  Have you ever been diagnosed with COVID-19?  n/a  Do you live in a group living residence, such as an assisted living facility, nursing home, shelter or dormitory?  n/a

## 2020-03-06 ENCOUNTER — Telehealth (INDEPENDENT_AMBULATORY_CARE_PROVIDER_SITE_OTHER): Payer: TRICARE Prime—HMO | Admitting: Psychiatric - Mental Health Nurse Practitioner (Across the Lifespan)

## 2020-03-06 ENCOUNTER — Encounter (HOSPITAL_BASED_OUTPATIENT_CLINIC_OR_DEPARTMENT_OTHER): Payer: Self-pay

## 2020-03-06 DIAGNOSIS — F4323 Adjustment disorder with mixed anxiety and depressed mood: Secondary | ICD-10-CM

## 2020-03-06 MED ORDER — DULOXETINE HCL 60 MG PO CPEP
ORAL_CAPSULE | ORAL | 0 refills | Status: AC
Start: 2020-03-06 — End: ?
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
## 2020-03-06 NOTE — Progress Notes (Signed)
Writer called pt, first had pt verify their full name and DOB, then reviewed medications and vitamins. No known allergies. Preferred pharmacy is noted. Pt knows to complete e-check in prior to visit, and knows to be in video visit 15 minutes prior to start time.

## 2020-03-06 NOTE — Progress Notes (Signed)
MA called pt, no answer.

## 2020-05-30 ENCOUNTER — Other Ambulatory Visit (HOSPITAL_BASED_OUTPATIENT_CLINIC_OR_DEPARTMENT_OTHER): Payer: Self-pay | Admitting: Psychiatric - Mental Health Nurse Practitioner (Across the Lifespan)

## 2021-12-27 IMAGING — MR MRI C-SPINE W/O *LEVEL BY LEVEL *AP DIAMETER *SEVERITY
2 series · 27 of 27 positions shown · non-contrast
Comparison: none

﻿ MR CERVICAL SPINE
INDICATION: Neck pain.  DOI:
TECHNIQUE: Multisequence multiplanar MRI of the cervical spine without contrast was performed on a low field strength open MRI scanner using standard protocol including sagittal T1 and T2; axial T1 and gradient echo; coronal T2 sequences without contrast.

[Series 2: scano sag/cor · coronal · 6.0mm · 1.02mm/px · 5 of 5 slices shown]
[im 1/5]
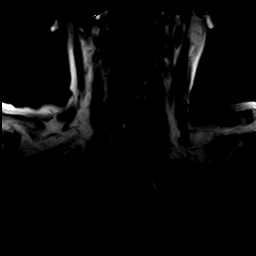
[im 2/5]
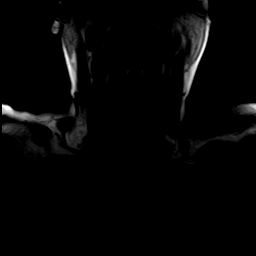
[im 3/5]
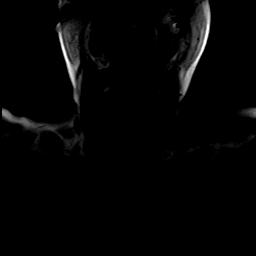
[im 4/5]
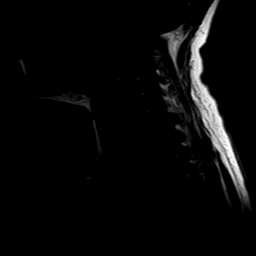
[im 5/5]
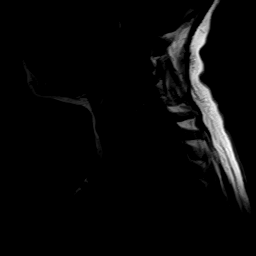

[Series 7: T1 · axial · 3.5mm · 0.78mm/px · z∈[-43,+48]mm · 22 of 22 slices shown]
[im 1/22]
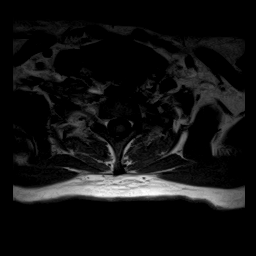
[im 2/22]
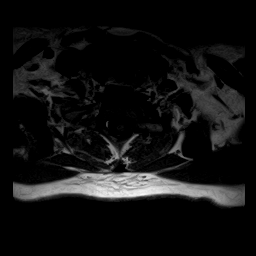
[im 3/22]
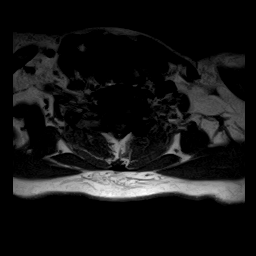
[im 4/22]
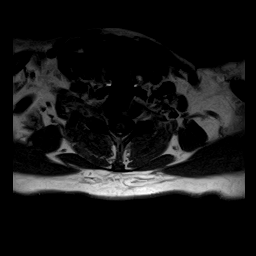
[im 5/22]
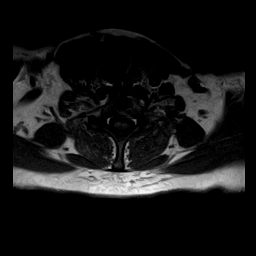
[im 6/22]
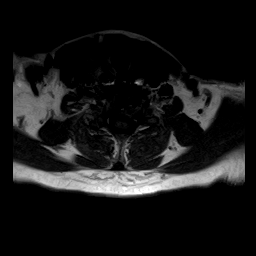
[im 7/22]
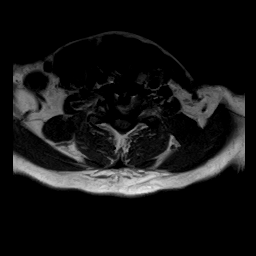
[im 8/22]
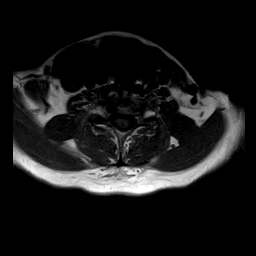
[im 9/22]
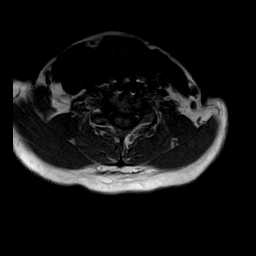
[im 10/22]
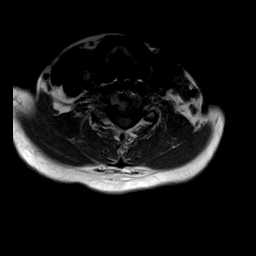
[im 11/22]
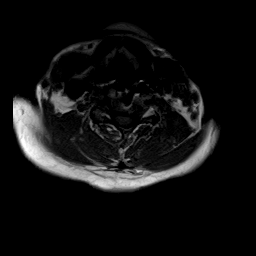
[im 12/22]
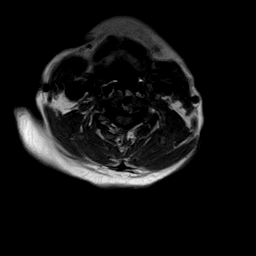
[im 13/22]
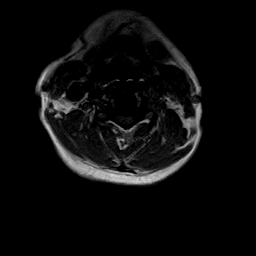
[im 14/22]
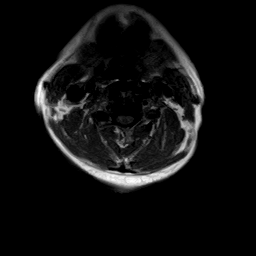
[im 15/22]
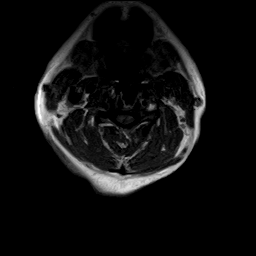
[im 16/22]
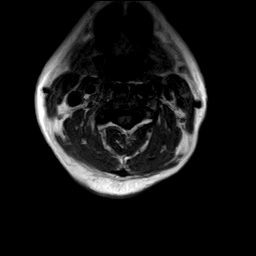
[im 17/22]
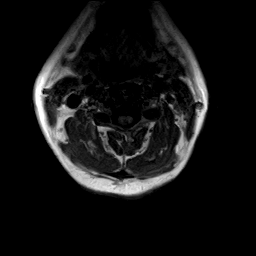
[im 18/22]
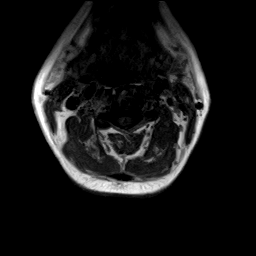
[im 19/22]
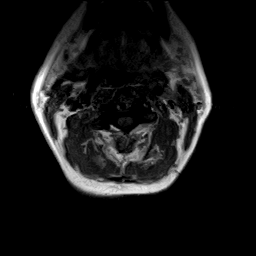
[im 20/22]
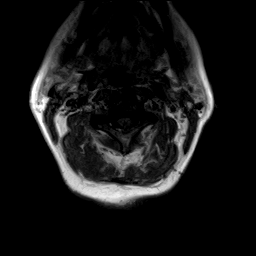
[im 21/22]
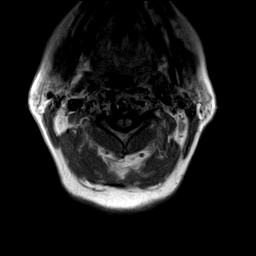
[im 22/22]
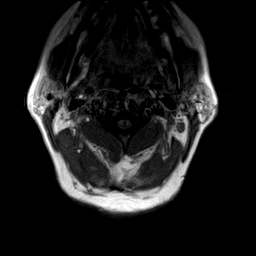

[27 of 27 positions shown; findings below may reference images not displayed]

FINDINGS: Visualized portions of the posterior fossa are unremarkable. The bone marrow and cord signal appears normal. There are no masses in the central canal or paraspinal region seen. Prevertebral soft tissues are normal. The atlanto-dens interval is intact. The craniocervical junction appears normal.  Straightening of the normal cervical lordosis.  Patient status post previous anterior cervical fusion and discectomy spanning C4-C7.  There appears to be osseous fusion across the C5-6 disc space and partial fusion across the C6-7 disc space.  Specific findings are seen at the following levels:

C2-C3: Disc osteophyte complex contributing to mild central canal stenosis.  No neuroforaminal narrowing.  AP diameter: 1 cm.

C3-C4: There is disc desiccation.  Anterior osteophyte formation.  Disc osteophyte complex contributing to mild central canal stenosis.  AP diameter: 1 cm.  No neuroforaminal narrowing.

C4-C5:  Status post decompression.  No disc herniation, central canal stenosis, or neuroforaminal narrowing.

C5-C6: Status post decompression.  Disc osteophyte complex contributing to mild to moderate central canal stenosis.  AP diameter: 9 mm.  No neuroforaminal narrowing.

C6-C7: Status post decompression.  Disc osteophyte complex with predominant left-sided posterior ridging, encroaching upon left lateral recess with moderate central canal stenosis.  Left-sided AP diameter: 8 mm.  Uncovertebral hypertrophy contributes to moderate left-sided neuroforaminal narrowing.

C7-T1: Disc osteophyte complex with asymmetric left sided posterior ridging encroaching upon left lateral recess.  There is moderate central canal stenosis.  Left-sided AP diameter 8 mm.  Uncovertebral joint hypertrophy contributes to moderate left-sided neuroforaminal narrowing.
IMPRESSION: 1. Status post anterior cervical fusion and discectomy spanning C4-C7.

2. Disc osteophyte complexes at C6-7 and C7-T1 with asymmetric posterior ridging encroaching upon left lateral recesses at both levels.

## 2021-12-27 IMAGING — MR MRI L-SPINE W/O *LEVEL BY LEVEL *AP DIAMETER *SEVERITY
6 series · 48 of 48 positions shown · non-contrast
Comparison: none

﻿ MRI LUMBAR SPINE
INDICATION: Back pain
TECHNIQUE: MRI lumbar spine was performed. Multiplanar multisequence imaging was performed using standard protocol on a low field strength open MRI scanner.

[Series 1: s-c scano · coronal · 6.0mm · 1.17mm/px · 3 of 5 slices shown]
[im 1/5]
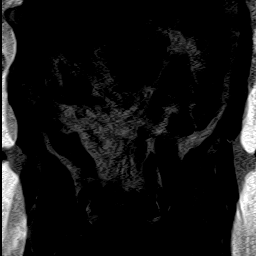
[im 3/5]
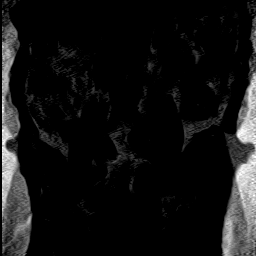
[im 5/5]
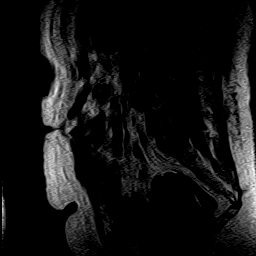

[Series 4: T2 · sagittal · 4.0mm · 1.17mm/px · 7 of 14 slices shown (1 of 3)]
[im 1/14]
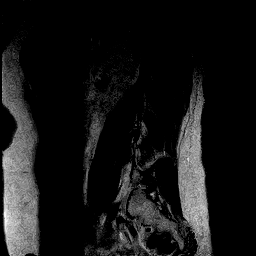
[im 3/14]
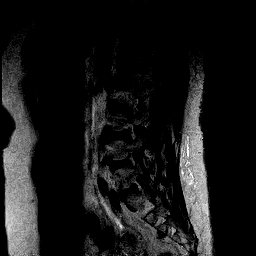
[im 5/14]
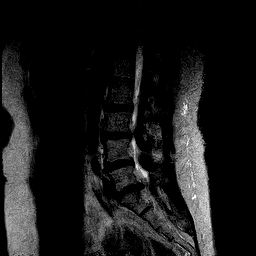
[im 7/14]
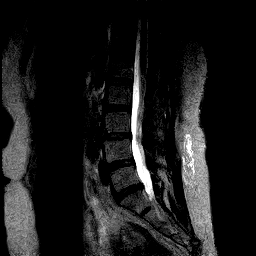
[im 9/14]
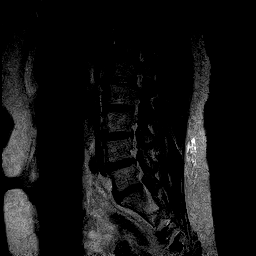
[im 11/14]
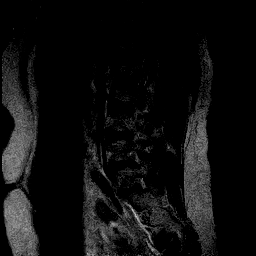
[im 14/14]
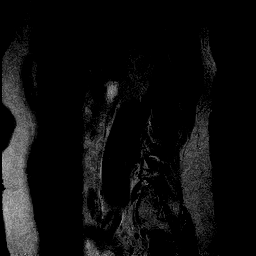

[Series 5: T1 · sagittal · 4.0mm · 1.17mm/px · 8 of 16 slices shown (1 of 2)]
[im 1/16]
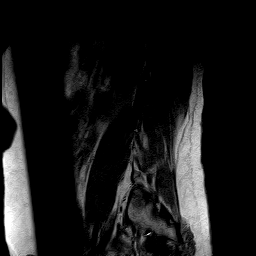
[im 3/16]
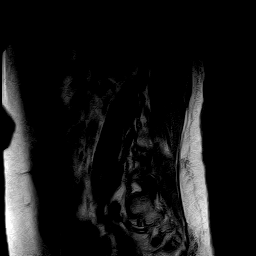
[im 5/16]
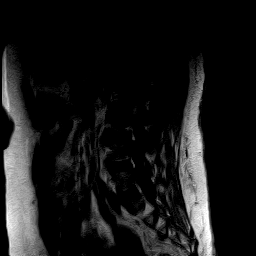
[im 7/16]
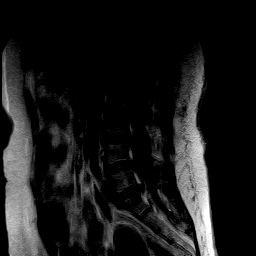
[im 9/16]
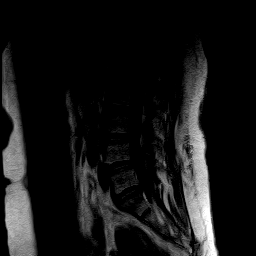
[im 11/16]
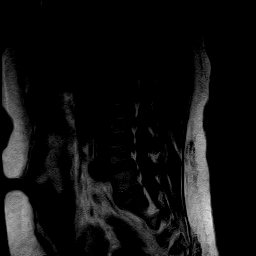
[im 13/16]
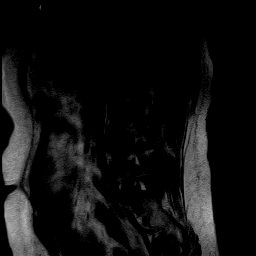
[im 16/16]
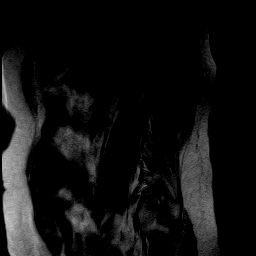

[Series 6: T2 · sagittal · 4.0mm · 1.17mm/px · 8 of 16 slices shown (2 of 3)]
[im 1/16]
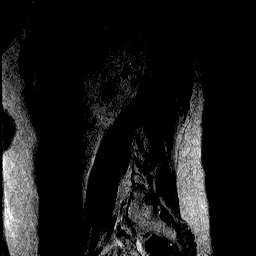
[im 3/16]
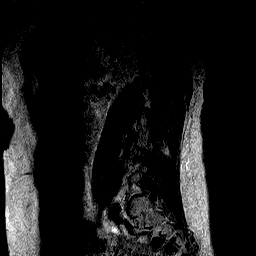
[im 5/16]
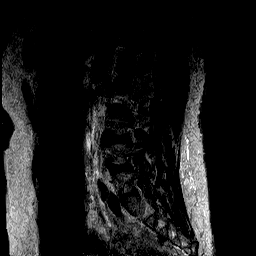
[im 7/16]
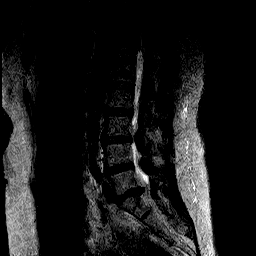
[im 9/16]
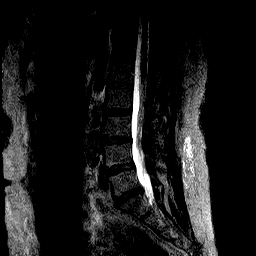
[im 11/16]
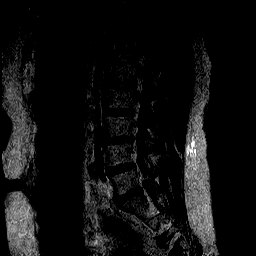
[im 13/16]
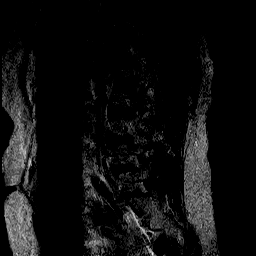
[im 16/16]
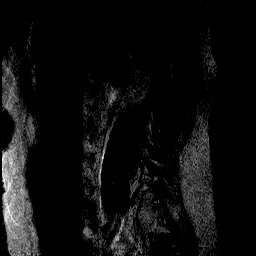

[Series 7: T2 · axial · 4.0mm · 0.98mm/px · z∈[-62,+95]mm · 11 of 20 slices shown (3 of 3)]
[im 1/20]
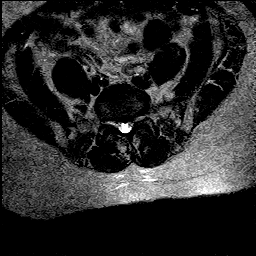
[im 2/20]
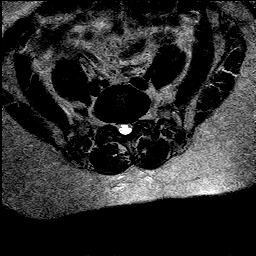
[im 4/20]
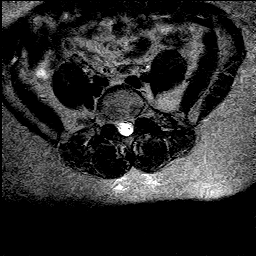
[im 6/20]
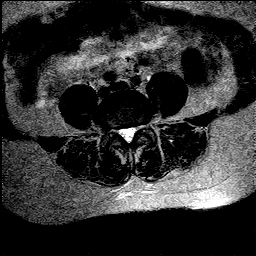
[im 8/20]
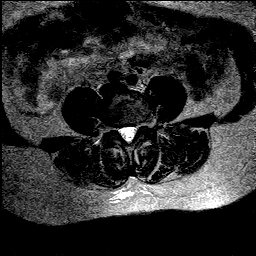
[im 10/20]
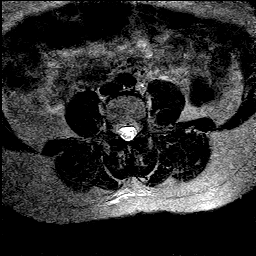
[im 12/20]
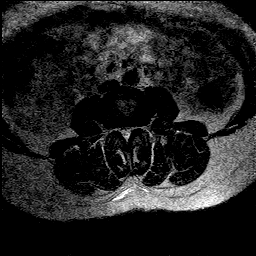
[im 14/20]
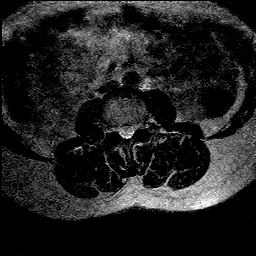
[im 16/20]
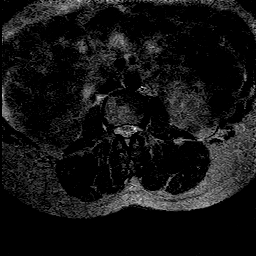
[im 18/20]
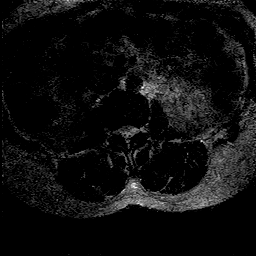
[im 20/20]
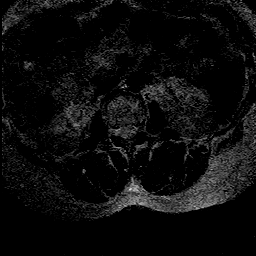

[Series 8: T1 · axial · 4.0mm · 0.98mm/px · z∈[-62,+95]mm · 11 of 20 slices shown (2 of 2)]
[im 1/20]
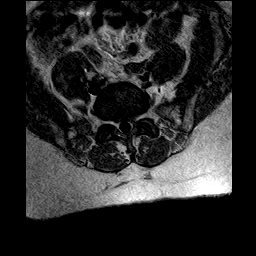
[im 2/20]
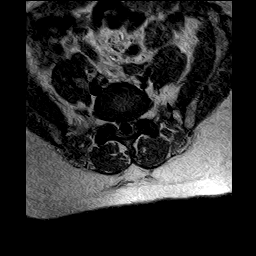
[im 4/20]
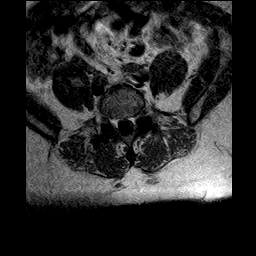
[im 6/20]
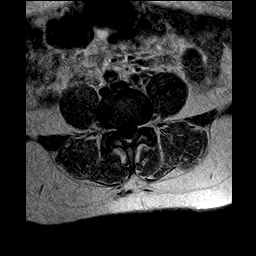
[im 8/20]
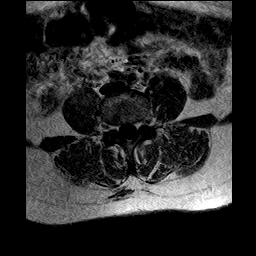
[im 10/20]
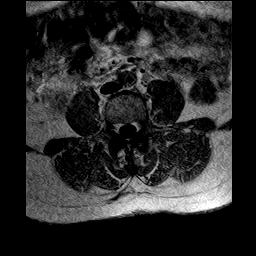
[im 12/20]
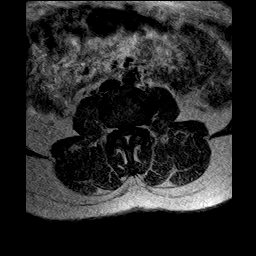
[im 14/20]
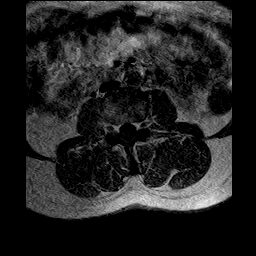
[im 16/20]
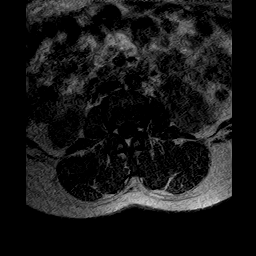
[im 18/20]
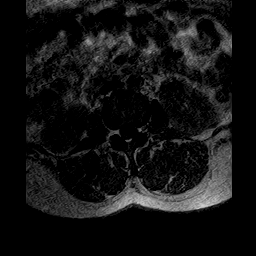
[im 20/20]
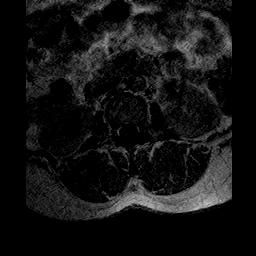

[48 of 48 positions shown; findings below may reference images not displayed]

FINDINGS: There are 5 lumbar type vertebrae. Vertebral body heights, vertebral alignment, and marrow signal are within normal limits.  Specific findings are identified at the following levels: 

L1-L2: Not included on the axial images.

L2-L3: No disc herniation, central canal stenosis, or neuroforaminal narrowing.  AP diameter: 1.3 cm

L3-L4: Mild intervertebral disc space narrowing.  Circumferential disc bulging and ligament of flavum infolding attributable moderate central canal stenosis.  AP diameter: 11 mm.  Disc bulging and facet arthrosis contributing mild left-sided neuroforaminal narrowing.

L4-L5: Circumferential disc bulging.  No disc herniation or central canal stenosis.  AP diameter: 14 mm.  Disc bulging and facet arthrosis contribute to mild left-sided neuroforaminal narrowing.

L5-S1: Circumferential disc bulging.  No disc herniation or central canal stenosis.  AP diameter: 14 mm.  Disc bulging and facet arthrosis contribute to mild bilateral neuroforaminal narrowing.

The visualized SI joints, paraspinal muscles and retroperitoneum are unremarkable.
IMPRESSION: 1. Mild degenerative disc disease at L3-4 and L4-5 with mild left-sided neuroforaminal narrowing.

2. Mild bilateral neuroforaminal narrowing at L5-S1.
